# Patient Record
Sex: Male | Born: 1975
Health system: Southern US, Community
[De-identification: ages and names within clinical notes are randomized; demographics above are authoritative.]

## PROBLEM LIST (undated history)

## (undated) DIAGNOSIS — E119 Type 2 diabetes mellitus without complications: Secondary | ICD-10-CM

## (undated) DIAGNOSIS — C801 Malignant (primary) neoplasm, unspecified: Secondary | ICD-10-CM

## (undated) HISTORY — PX: WISDOM TOOTH EXTRACTION: SHX21

---

## 2012-01-14 ENCOUNTER — Ambulatory Visit (INDEPENDENT_AMBULATORY_CARE_PROVIDER_SITE_OTHER): Payer: PRIVATE HEALTH INSURANCE | Admitting: Ophthalmology

## 2012-01-14 DIAGNOSIS — E11359 Type 2 diabetes mellitus with proliferative diabetic retinopathy without macular edema: Secondary | ICD-10-CM

## 2012-01-14 DIAGNOSIS — E1139 Type 2 diabetes mellitus with other diabetic ophthalmic complication: Secondary | ICD-10-CM

## 2012-01-14 DIAGNOSIS — E1165 Type 2 diabetes mellitus with hyperglycemia: Secondary | ICD-10-CM

## 2012-01-14 DIAGNOSIS — H43819 Vitreous degeneration, unspecified eye: Secondary | ICD-10-CM

## 2012-01-14 DIAGNOSIS — H2510 Age-related nuclear cataract, unspecified eye: Secondary | ICD-10-CM

## 2013-01-19 ENCOUNTER — Ambulatory Visit (INDEPENDENT_AMBULATORY_CARE_PROVIDER_SITE_OTHER): Payer: PRIVATE HEALTH INSURANCE | Admitting: Ophthalmology

## 2013-03-06 ENCOUNTER — Ambulatory Visit (INDEPENDENT_AMBULATORY_CARE_PROVIDER_SITE_OTHER): Payer: Self-pay | Admitting: Ophthalmology

## 2013-05-11 ENCOUNTER — Ambulatory Visit (INDEPENDENT_AMBULATORY_CARE_PROVIDER_SITE_OTHER): Payer: BC Managed Care – PPO | Admitting: Ophthalmology

## 2013-05-11 DIAGNOSIS — H43819 Vitreous degeneration, unspecified eye: Secondary | ICD-10-CM

## 2013-05-11 DIAGNOSIS — H2510 Age-related nuclear cataract, unspecified eye: Secondary | ICD-10-CM

## 2013-05-11 DIAGNOSIS — E1165 Type 2 diabetes mellitus with hyperglycemia: Secondary | ICD-10-CM

## 2013-05-11 DIAGNOSIS — E1139 Type 2 diabetes mellitus with other diabetic ophthalmic complication: Secondary | ICD-10-CM

## 2013-05-11 DIAGNOSIS — E11359 Type 2 diabetes mellitus with proliferative diabetic retinopathy without macular edema: Secondary | ICD-10-CM

## 2014-05-26 ENCOUNTER — Ambulatory Visit (INDEPENDENT_AMBULATORY_CARE_PROVIDER_SITE_OTHER): Payer: 59 | Admitting: Ophthalmology

## 2014-05-26 DIAGNOSIS — H43813 Vitreous degeneration, bilateral: Secondary | ICD-10-CM | POA: Diagnosis not present

## 2014-05-26 DIAGNOSIS — E10359 Type 1 diabetes mellitus with proliferative diabetic retinopathy without macular edema: Secondary | ICD-10-CM | POA: Diagnosis not present

## 2014-05-26 DIAGNOSIS — E10319 Type 1 diabetes mellitus with unspecified diabetic retinopathy without macular edema: Secondary | ICD-10-CM

## 2015-06-01 ENCOUNTER — Ambulatory Visit (INDEPENDENT_AMBULATORY_CARE_PROVIDER_SITE_OTHER): Payer: 59 | Admitting: Ophthalmology

## 2015-06-27 ENCOUNTER — Ambulatory Visit (INDEPENDENT_AMBULATORY_CARE_PROVIDER_SITE_OTHER): Payer: BLUE CROSS/BLUE SHIELD | Admitting: Ophthalmology

## 2015-06-27 DIAGNOSIS — H2513 Age-related nuclear cataract, bilateral: Secondary | ICD-10-CM

## 2015-06-27 DIAGNOSIS — E103593 Type 1 diabetes mellitus with proliferative diabetic retinopathy without macular edema, bilateral: Secondary | ICD-10-CM

## 2015-06-27 DIAGNOSIS — E10319 Type 1 diabetes mellitus with unspecified diabetic retinopathy without macular edema: Secondary | ICD-10-CM

## 2015-06-27 DIAGNOSIS — H43813 Vitreous degeneration, bilateral: Secondary | ICD-10-CM | POA: Diagnosis not present

## 2016-06-27 DIAGNOSIS — E119 Type 2 diabetes mellitus without complications: Secondary | ICD-10-CM | POA: Diagnosis not present

## 2016-06-27 DIAGNOSIS — I10 Essential (primary) hypertension: Secondary | ICD-10-CM | POA: Diagnosis not present

## 2016-06-27 DIAGNOSIS — E785 Hyperlipidemia, unspecified: Secondary | ICD-10-CM | POA: Diagnosis not present

## 2016-06-28 ENCOUNTER — Ambulatory Visit (INDEPENDENT_AMBULATORY_CARE_PROVIDER_SITE_OTHER): Payer: 59 | Admitting: Ophthalmology

## 2016-06-28 DIAGNOSIS — E10319 Type 1 diabetes mellitus with unspecified diabetic retinopathy without macular edema: Secondary | ICD-10-CM | POA: Diagnosis not present

## 2016-06-28 DIAGNOSIS — E103593 Type 1 diabetes mellitus with proliferative diabetic retinopathy without macular edema, bilateral: Secondary | ICD-10-CM | POA: Diagnosis not present

## 2016-06-28 DIAGNOSIS — H43813 Vitreous degeneration, bilateral: Secondary | ICD-10-CM

## 2017-02-11 DIAGNOSIS — E119 Type 2 diabetes mellitus without complications: Secondary | ICD-10-CM | POA: Diagnosis not present

## 2017-02-11 DIAGNOSIS — E7800 Pure hypercholesterolemia, unspecified: Secondary | ICD-10-CM | POA: Diagnosis not present

## 2017-02-11 DIAGNOSIS — Z125 Encounter for screening for malignant neoplasm of prostate: Secondary | ICD-10-CM | POA: Diagnosis not present

## 2017-02-11 DIAGNOSIS — I10 Essential (primary) hypertension: Secondary | ICD-10-CM | POA: Diagnosis not present

## 2017-07-01 ENCOUNTER — Ambulatory Visit (INDEPENDENT_AMBULATORY_CARE_PROVIDER_SITE_OTHER): Payer: 59 | Admitting: Ophthalmology

## 2017-07-22 ENCOUNTER — Encounter (INDEPENDENT_AMBULATORY_CARE_PROVIDER_SITE_OTHER): Payer: 59 | Admitting: Ophthalmology

## 2017-07-22 DIAGNOSIS — E103593 Type 1 diabetes mellitus with proliferative diabetic retinopathy without macular edema, bilateral: Secondary | ICD-10-CM | POA: Diagnosis not present

## 2017-07-22 DIAGNOSIS — E10319 Type 1 diabetes mellitus with unspecified diabetic retinopathy without macular edema: Secondary | ICD-10-CM | POA: Diagnosis not present

## 2017-07-22 DIAGNOSIS — H43813 Vitreous degeneration, bilateral: Secondary | ICD-10-CM

## 2017-11-15 DIAGNOSIS — E7800 Pure hypercholesterolemia, unspecified: Secondary | ICD-10-CM | POA: Diagnosis not present

## 2017-11-15 DIAGNOSIS — I10 Essential (primary) hypertension: Secondary | ICD-10-CM | POA: Diagnosis not present

## 2017-11-15 DIAGNOSIS — E119 Type 2 diabetes mellitus without complications: Secondary | ICD-10-CM | POA: Diagnosis not present

## 2018-07-28 ENCOUNTER — Encounter (INDEPENDENT_AMBULATORY_CARE_PROVIDER_SITE_OTHER): Payer: 59 | Admitting: Ophthalmology

## 2018-09-17 ENCOUNTER — Encounter (INDEPENDENT_AMBULATORY_CARE_PROVIDER_SITE_OTHER): Payer: 59 | Admitting: Ophthalmology

## 2019-01-22 ENCOUNTER — Encounter (INDEPENDENT_AMBULATORY_CARE_PROVIDER_SITE_OTHER): Payer: Self-pay | Admitting: Ophthalmology

## 2019-03-11 ENCOUNTER — Encounter (INDEPENDENT_AMBULATORY_CARE_PROVIDER_SITE_OTHER): Payer: Self-pay | Admitting: Ophthalmology

## 2019-03-30 ENCOUNTER — Encounter (INDEPENDENT_AMBULATORY_CARE_PROVIDER_SITE_OTHER): Payer: Self-pay | Admitting: Ophthalmology

## 2019-04-01 ENCOUNTER — Encounter (INDEPENDENT_AMBULATORY_CARE_PROVIDER_SITE_OTHER): Payer: Self-pay | Admitting: Ophthalmology

## 2019-06-24 ENCOUNTER — Encounter (INDEPENDENT_AMBULATORY_CARE_PROVIDER_SITE_OTHER): Payer: Self-pay | Admitting: Ophthalmology

## 2020-06-20 ENCOUNTER — Other Ambulatory Visit: Payer: Self-pay | Admitting: Surgery

## 2020-06-20 ENCOUNTER — Other Ambulatory Visit: Payer: Self-pay | Admitting: Otolaryngology

## 2020-06-20 DIAGNOSIS — M7989 Other specified soft tissue disorders: Secondary | ICD-10-CM

## 2020-06-20 DIAGNOSIS — C029 Malignant neoplasm of tongue, unspecified: Secondary | ICD-10-CM

## 2020-07-04 ENCOUNTER — Ambulatory Visit
Admission: RE | Admit: 2020-07-04 | Discharge: 2020-07-04 | Disposition: A | Discharge status: Home / Self Care | Payer: 59 | Source: Ambulatory Visit | Attending: Otolaryngology | Admitting: Otolaryngology

## 2020-07-04 DIAGNOSIS — C029 Malignant neoplasm of tongue, unspecified: Secondary | ICD-10-CM

## 2020-07-04 MED ORDER — IOPAMIDOL (ISOVUE-300) INJECTION 61%
75.0000 mL | Freq: Once | INTRAVENOUS | Status: AC | PRN
  Administered 2020-07-04: 75 mL via INTRAVENOUS

## 2020-07-06 ENCOUNTER — Other Ambulatory Visit (HOSPITAL_COMMUNITY): Payer: Self-pay | Admitting: Otolaryngology

## 2020-07-06 ENCOUNTER — Other Ambulatory Visit: Payer: Self-pay | Admitting: Otolaryngology

## 2020-07-06 DIAGNOSIS — C029 Malignant neoplasm of tongue, unspecified: Secondary | ICD-10-CM

## 2020-07-22 ENCOUNTER — Ambulatory Visit (HOSPITAL_COMMUNITY)
Admission: RE | Admit: 2020-07-22 | Discharge: 2020-07-22 | Disposition: A | Discharge status: Home / Self Care | Payer: 59 | Source: Ambulatory Visit | Attending: Otolaryngology | Admitting: Otolaryngology

## 2020-07-22 ENCOUNTER — Other Ambulatory Visit: Payer: Self-pay

## 2020-07-22 DIAGNOSIS — C029 Malignant neoplasm of tongue, unspecified: Secondary | ICD-10-CM | POA: Insufficient documentation

## 2020-07-22 LAB — GLUCOSE, CAPILLARY: Glucose-Capillary: 169 mg/dL — ABNORMAL HIGH (ref 70–99)

## 2020-07-22 MED ORDER — FLUDEOXYGLUCOSE F - 18 (FDG) INJECTION
11.0000 | Freq: Once | INTRAVENOUS | Status: AC | PRN
  Administered 2020-07-22: 8.23 via INTRAVENOUS

## 2020-07-26 ENCOUNTER — Other Ambulatory Visit: Payer: Self-pay | Admitting: Otolaryngology

## 2020-07-27 NOTE — Pre-Procedure Instructions (Signed)
Surgical Instructions    Your procedure is scheduled on 07/30/20.  Report to [facility] Main Entrance "A" at 06:30 A.M., then check in with the Admitting office.  Call this number if you have problems the morning of surgery:  [phone]   If you have any questions prior to your surgery date call [phone]: Open Monday-Friday 8am-4pm    Remember:  Do not eat or drink after midnight the night before your surgery    Take these medicines the morning of surgery with A SIP OF WATER  oxyCODONE-acetaminophen (PERCOCET/ROXICET) if needed  As of today, STOP taking any Aspirin (unless otherwise instructed by your surgeon) Aleve, Naproxen, Ibuprofen, Motrin, Advil, Goody's, BC's, all herbal medications, fish oil, and all vitamins.  WHAT DO I DO ABOUT MY DIABETES MEDICATION?   THE NIGHT BEFORE SURGERY, take _____6-8______ units of ___Lantus______insulin.      THE MORNING OF SURGERY, take _____6-8________ units of ____Lantus______insulin.  If your CBG is greater than 220 mg/dL, you may take  of your sliding scale (correction) dose of insulin.   HOW TO MANAGE YOUR DIABETES BEFORE AND AFTER SURGERY  Why is it important to control my blood sugar before and after surgery? Improving blood sugar levels before and after surgery helps healing and can limit problems. A way of improving blood sugar control is eating a healthy diet by:  Eating less sugar and carbohydrates  Increasing activity/exercise  Talking with your doctor about reaching your blood sugar goals High blood sugars (greater than 180 mg/dL) can raise your risk of infections and slow your recovery, so you will need to focus on controlling your diabetes during the weeks before surgery. Make sure that the doctor who takes care of your diabetes knows about your planned surgery including the date and location.  How do I manage my blood sugar before surgery? Check your blood sugar at least 4 times a day, starting 2 days before  surgery, to make sure that the level is not too high or low.  Check your blood sugar the morning of your surgery when you wake up and every 2 hours until you get to the Short Stay unit.  If your blood sugar is less than 70 mg/dL, you will need to treat for low blood sugar: Do not take insulin. Treat a low blood sugar (less than 70 mg/dL) with  cup of clear juice (cranberry or apple), 4 glucose tablets, OR glucose gel. Recheck blood sugar in 15 minutes after treatment (to make sure it is greater than 70 mg/dL). If your blood sugar is not greater than 70 mg/dL on recheck, call [phone] for further instructions. Report your blood sugar to the short stay nurse when you get to Short Stay.  If you are admitted to the hospital after surgery: Your blood sugar will be checked by the staff and you will probably be given insulin after surgery (instead of oral diabetes medicines) to make sure you have good blood sugar levels. The goal for blood sugar control after surgery is 80-180 mg/dL.           Do not wear jewelry or makeup Do not wear lotions, powders, perfumes/colognes, or deodorant. Do not shave 48 hours prior to surgery.  Men may shave face and neck. Do not bring valuables to the hospital. DO Not wear nail polish, gel polish, artificial nails, or any other type of covering on  natural nails including finger and toenails. If patients have artificial nails, gel coating, etc. that need to be  removed by a nail salon please have this removed prior to surgery or surgery may need to be canceled/delayed if the surgeon/ anesthesia feels like the patient is unable to be adequately monitored.             [facility] is not responsible for any belongings or valuables.  Do NOT Smoke (Tobacco/Vaping) or drink Alcohol 24 hours prior to your procedure If you use a CPAP at night, you may bring all equipment for your overnight stay.   Contacts, glasses, dentures or bridgework may not be worn into surgery,  please bring cases for these belongings   For patients admitted to the hospital, discharge time will be determined by your treatment team.   Patients discharged the day of surgery will not be allowed to drive home, and someone needs to stay with them for 24 hours.  ONLY 1 SUPPORT PERSON MAY BE PRESENT WHILE YOU ARE IN SURGERY. IF YOU ARE TO BE ADMITTED ONCE YOU ARE IN YOUR ROOM YOU WILL BE ALLOWED TWO (2) VISITORS.  Minor children may have two parents present. Special consideration for safety and communication needs will be reviewed on a case by case basis.  Special instructions:    Oral Hygiene is also important to reduce your risk of infection.  Remember - BRUSH YOUR TEETH THE MORNING OF SURGERY WITH YOUR REGULAR TOOTHPASTE   [facility]- Preparing For Surgery  Before surgery, you can play an important role. Because skin is not sterile, your skin needs to be as free of germs as possible. You can reduce the number of germs on your skin by washing with CHG (chlorahexidine gluconate) Soap before surgery.  CHG is an antiseptic cleaner which kills germs and bonds with the skin to continue killing germs even after washing.     Please do not use if you have an allergy to CHG or antibacterial soaps. If your skin becomes reddened/irritated stop using the CHG.  Do not shave (including legs and underarms) for at least 48 hours prior to first CHG shower. It is OK to shave your face.  Please follow these instructions carefully.     Shower the NIGHT BEFORE SURGERY and the MORNING OF SURGERY with CHG Soap.   If you chose to wash your hair, wash your hair first as usual with your normal shampoo. After you shampoo, rinse your hair and body thoroughly to remove the shampoo.  Then [employer] and genitals (private parts) with your normal soap and rinse thoroughly to remove soap.  After that Use CHG Soap as you would any other liquid soap. You can apply CHG directly to the skin and wash gently with a scrungie  or a clean washcloth.   Apply the CHG Soap to your body ONLY FROM THE NECK DOWN.  Do not use on open wounds or open sores. Avoid contact with your eyes, ears, mouth and genitals (private parts). Wash Face and genitals (private parts)  with your normal soap.   Wash thoroughly, paying special attention to the area where your surgery will be performed.  Thoroughly rinse your body with warm water from the neck down.  DO NOT shower/wash with your normal soap after using and rinsing off the CHG Soap.  Pat yourself dry with a CLEAN TOWEL.  Wear CLEAN PAJAMAS to bed the night before surgery  Place CLEAN SHEETS on your bed the night before your surgery  DO NOT SLEEP WITH PETS.   Day of Surgery:  Take a shower with CHG  soap. Wear Clean/Comfortable clothing the morning of surgery Do not apply any deodorants/lotions.   Remember to brush your teeth WITH YOUR REGULAR TOOTHPASTE.   Please read over the following fact sheets that you were given.

## 2020-07-28 ENCOUNTER — Other Ambulatory Visit: Payer: Self-pay

## 2020-07-28 ENCOUNTER — Encounter (HOSPITAL_COMMUNITY)
Admission: RE | Admit: 2020-07-28 | Discharge: 2020-07-28 | Disposition: A | Discharge status: Home / Self Care | Payer: 59 | Source: Ambulatory Visit | Attending: Otolaryngology | Admitting: Otolaryngology

## 2020-07-28 ENCOUNTER — Encounter (HOSPITAL_COMMUNITY): Payer: Self-pay

## 2020-07-28 DIAGNOSIS — Z01818 Encounter for other preprocedural examination: Secondary | ICD-10-CM | POA: Diagnosis present

## 2020-07-28 HISTORY — DX: Malignant (primary) neoplasm, unspecified: C80.1

## 2020-07-28 HISTORY — DX: Type 2 diabetes mellitus without complications: E11.9

## 2020-07-28 LAB — CBC
HCT: 38.7 % — ABNORMAL LOW (ref 39.0–52.0)
Hemoglobin: 13.4 g/dL (ref 13.0–17.0)
MCH: 30.6 pg (ref 26.0–34.0)
MCHC: 34.6 g/dL (ref 30.0–36.0)
MCV: 88.4 fL (ref 80.0–100.0)
Platelets: 365 10*3/uL (ref 150–400)
RBC: 4.38 MIL/uL (ref 4.22–5.81)
RDW: 12.3 % (ref 11.5–15.5)
WBC: 6.5 10*3/uL (ref 4.0–10.5)
nRBC: 0 % (ref 0.0–0.2)

## 2020-07-28 LAB — BASIC METABOLIC PANEL
Anion gap: 7 (ref 5–15)
BUN: 9 mg/dL (ref 6–20)
CO2: 26 mmol/L (ref 22–32)
Calcium: 9.1 mg/dL (ref 8.9–10.3)
Chloride: 104 mmol/L (ref 98–111)
Creatinine, Ser: 0.91 mg/dL (ref 0.61–1.24)
GFR, Estimated: >60 mL/min (ref >60)
Glucose, Bld: 256 mg/dL — ABNORMAL HIGH (ref 70–99)
Potassium: 4.2 mmol/L (ref 3.5–5.1)
Sodium: 137 mmol/L (ref 135–145)

## 2020-07-28 LAB — HEMOGLOBIN A1C
Hgb A1c MFr Bld: 7.2 % — ABNORMAL HIGH (ref 4.8–5.6)
Mean Plasma Glucose: 159.94 mg/dL

## 2020-07-28 LAB — GLUCOSE, CAPILLARY: Glucose-Capillary: 253 mg/dL — ABNORMAL HIGH (ref 70–99)

## 2020-07-28 NOTE — Progress Notes (Signed)
PCP: Shirline Frees Cardiologist: Denies  EKG: 07/28/20 CXR: denies ECHO: denies Stress Test: denies Cardiac Cath: denies  Fasting Blood Sugar- 70-250's Checks Blood Sugar__4-5_ times a day  OSA/CPAP: No  ASA/Blood Thinners: No  Covid test not needed  Anesthesia Review: No  Patient denies shortness of breath, fever, cough, and chest pain at PAT appointment.  Patient verbalized understanding of instructions provided today at the PAT appointment.  Patient asked to review instructions at home and day of surgery.

## 2020-07-30 ENCOUNTER — Observation Stay (HOSPITAL_COMMUNITY)
Admission: RE | Admit: 2020-07-30 | Discharge: 2020-07-31 | Disposition: A | Discharge status: Home / Self Care | Payer: 59 | Attending: Otolaryngology | Admitting: Otolaryngology

## 2020-07-30 ENCOUNTER — Ambulatory Visit (HOSPITAL_COMMUNITY): Payer: 59 | Admitting: Anesthesiology

## 2020-07-30 ENCOUNTER — Encounter (HOSPITAL_COMMUNITY)
Admission: RE | Disposition: A | Discharge status: Home / Self Care | Payer: Self-pay | Source: Home / Self Care | Attending: Otolaryngology

## 2020-07-30 ENCOUNTER — Other Ambulatory Visit: Payer: Self-pay

## 2020-07-30 ENCOUNTER — Encounter (HOSPITAL_COMMUNITY): Payer: Self-pay | Admitting: Otolaryngology

## 2020-07-30 DIAGNOSIS — E119 Type 2 diabetes mellitus without complications: Secondary | ICD-10-CM | POA: Insufficient documentation

## 2020-07-30 DIAGNOSIS — I10 Essential (primary) hypertension: Secondary | ICD-10-CM | POA: Insufficient documentation

## 2020-07-30 DIAGNOSIS — Z20822 Contact with and (suspected) exposure to covid-19: Secondary | ICD-10-CM | POA: Insufficient documentation

## 2020-07-30 DIAGNOSIS — Z794 Long term (current) use of insulin: Secondary | ICD-10-CM | POA: Insufficient documentation

## 2020-07-30 DIAGNOSIS — C029 Malignant neoplasm of tongue, unspecified: Principal | ICD-10-CM | POA: Insufficient documentation

## 2020-07-30 DIAGNOSIS — Z9049 Acquired absence of other specified parts of digestive tract: Secondary | ICD-10-CM

## 2020-07-30 HISTORY — PX: PARTIAL GLOSSECTOMY: SHX2173

## 2020-07-30 LAB — GLUCOSE, CAPILLARY
Glucose-Capillary: 164 mg/dL — ABNORMAL HIGH (ref 70–99)
Glucose-Capillary: 175 mg/dL — ABNORMAL HIGH (ref 70–99)
Glucose-Capillary: 159 mg/dL — ABNORMAL HIGH (ref 70–99)
Glucose-Capillary: 183 mg/dL — ABNORMAL HIGH (ref 70–99)
Glucose-Capillary: 155 mg/dL — ABNORMAL HIGH (ref 70–99)
Glucose-Capillary: 88 mg/dL (ref 70–99)

## 2020-07-30 LAB — SARS CORONAVIRUS 2 BY RT PCR (HOSPITAL ORDER, PERFORMED IN ~~LOC~~ HOSPITAL LAB): SARS Coronavirus 2: NEGATIVE

## 2020-07-30 SURGERY — GLOSSECTOMY, PARTIAL
Anesthesia: General | Laterality: Right

## 2020-07-30 MED ORDER — LIDOCAINE-EPINEPHRINE 1 %-1:100000 IJ SOLN
INTRAMUSCULAR | Status: DC | PRN
  Administered 2020-07-30: 8 mL via INTRADERMAL

## 2020-07-30 MED ORDER — PROPOFOL 10 MG/ML IV BOLUS
INTRAVENOUS | Status: AC
  Filled 2020-07-30: qty 40

## 2020-07-30 MED ORDER — FENTANYL CITRATE (PF) 250 MCG/5ML IJ SOLN
INTRAMUSCULAR | Status: DC | PRN
  Administered 2020-07-30: 100 ug via INTRAVENOUS

## 2020-07-30 MED ORDER — ORAL CARE MOUTH RINSE: 15.0000 mL | Freq: Once | OROMUCOSAL | Status: DC

## 2020-07-30 MED ORDER — DEXMEDETOMIDINE (PRECEDEX) IN NS 20 MCG/5ML (4 MCG/ML) IV SYRINGE
PREFILLED_SYRINGE | INTRAVENOUS | Status: DC | PRN
  Administered 2020-07-30: 4 ug via INTRAVENOUS
  Administered 2020-07-30: 12 ug via INTRAVENOUS

## 2020-07-30 MED ORDER — MIDAZOLAM HCL 2 MG/2ML IJ SOLN
INTRAMUSCULAR | Status: DC | PRN
  Administered 2020-07-30: 2 mg via INTRAVENOUS

## 2020-07-30 MED ORDER — FAMOTIDINE IN NACL 20-0.9 MG/50ML-% IV SOLN
20.0000 mg | INTRAVENOUS | Status: DC
  Administered 2020-07-30: 20 mg via INTRAVENOUS
  Filled 2020-07-30 (×2): qty 50

## 2020-07-30 MED ORDER — ONDANSETRON HCL 4 MG/2ML IJ SOLN
INTRAMUSCULAR | Status: DC | PRN
  Administered 2020-07-30: 4 mg via INTRAVENOUS

## 2020-07-30 MED ORDER — INSULIN GLARGINE 100 UNIT/ML ~~LOC~~ SOLN
8.0000 [IU] | Freq: Two times a day (BID) | SUBCUTANEOUS | Status: DC
  Administered 2020-07-30 – 2020-07-31 (×2): 8 [IU] via SUBCUTANEOUS
  Filled 2020-07-30 (×3): qty 0.08

## 2020-07-30 MED ORDER — PROPOFOL 500 MG/50ML IV EMUL
INTRAVENOUS | Status: DC | PRN
  Administered 2020-07-30: 150 ug/kg/min via INTRAVENOUS

## 2020-07-30 MED ORDER — HYDROMORPHONE HCL 1 MG/ML IJ SOLN
INTRAMUSCULAR | Status: AC
  Filled 2020-07-30: qty 0.5

## 2020-07-30 MED ORDER — AMISULPRIDE (ANTIEMETIC) 5 MG/2ML IV SOLN
10.0000 mg | Freq: Once | INTRAVENOUS | Status: DC | PRN
Start: 2020-07-30 — End: 2020-07-30

## 2020-07-30 MED ORDER — MORPHINE SULFATE (PF) 2 MG/ML IV SOLN
2.0000 mg | INTRAVENOUS | Status: DC | PRN
Start: 2020-07-30 — End: 2020-07-31

## 2020-07-30 MED ORDER — PROPOFOL 10 MG/ML IV BOLUS
INTRAVENOUS | Status: DC | PRN
  Administered 2020-07-30: 50 mg via INTRAVENOUS
  Administered 2020-07-30: 30 mg via INTRAVENOUS
  Administered 2020-07-30: 150 mg via INTRAVENOUS
  Administered 2020-07-30: 30 mg via INTRAVENOUS

## 2020-07-30 MED ORDER — PROPOFOL 1000 MG/100ML IV EMUL
INTRAVENOUS | Status: AC
  Filled 2020-07-30: qty 200

## 2020-07-30 MED ORDER — HYDROCODONE-ACETAMINOPHEN 7.5-325 MG/15ML PO SOLN
10.0000 mL | ORAL | Status: DC | PRN
Start: 2020-07-30 — End: 2020-07-31
  Administered 2020-07-30 – 2020-07-31 (×3): 15 mL via ORAL
  Filled 2020-07-30 (×3): qty 15

## 2020-07-30 MED ORDER — CEFAZOLIN SODIUM-DEXTROSE 1-4 GM/50ML-% IV SOLN
1.0000 g | Freq: Three times a day (TID) | INTRAVENOUS | Status: AC
  Administered 2020-07-30 – 2020-07-31 (×3): 1 g via INTRAVENOUS
  Filled 2020-07-30 (×3): qty 50

## 2020-07-30 MED ORDER — BACITRACIN ZINC 500 UNIT/GM EX OINT
TOPICAL_OINTMENT | CUTANEOUS | Status: AC
  Filled 2020-07-30: qty 28.35

## 2020-07-30 MED ORDER — LIDOCAINE-EPINEPHRINE 1 %-1:100000 IJ SOLN
INTRAMUSCULAR | Status: AC
  Filled 2020-07-30: qty 1

## 2020-07-30 MED ORDER — PHENYLEPHRINE HCL-NACL 10-0.9 MG/250ML-% IV SOLN
INTRAVENOUS | Status: AC
  Filled 2020-07-30: qty 250

## 2020-07-30 MED ORDER — SODIUM CHLORIDE 0.9 % IV SOLN
0.0500 ug/kg/min | INTRAVENOUS | Status: DC
  Administered 2020-07-30: .1 ug/kg/min via INTRAVENOUS
  Filled 2020-07-30: qty 4000

## 2020-07-30 MED ORDER — ACETAMINOPHEN 500 MG PO TABS
1000.0000 mg | ORAL_TABLET | Freq: Once | ORAL | Status: AC
  Administered 2020-07-30: 1000 mg via ORAL
  Filled 2020-07-30: qty 2

## 2020-07-30 MED ORDER — MIDAZOLAM HCL 2 MG/2ML IJ SOLN
INTRAMUSCULAR | Status: AC
  Filled 2020-07-30: qty 2

## 2020-07-30 MED ORDER — FENTANYL CITRATE (PF) 250 MCG/5ML IJ SOLN
INTRAMUSCULAR | Status: AC
  Filled 2020-07-30: qty 5

## 2020-07-30 MED ORDER — LACTATED RINGERS IV SOLN: INTRAVENOUS | Status: DC

## 2020-07-30 MED ORDER — CEFAZOLIN SODIUM-DEXTROSE 2-4 GM/100ML-% IV SOLN
INTRAVENOUS | Status: AC
  Filled 2020-07-30: qty 100

## 2020-07-30 MED ORDER — SUCCINYLCHOLINE CHLORIDE 200 MG/10ML IV SOSY
PREFILLED_SYRINGE | INTRAVENOUS | Status: AC
  Filled 2020-07-30: qty 10

## 2020-07-30 MED ORDER — HYDROMORPHONE HCL 1 MG/ML IJ SOLN
INTRAMUSCULAR | Status: DC | PRN
  Administered 2020-07-30: .25 mg via INTRAVENOUS
  Administered 2020-07-30: .5 mg via INTRAVENOUS
  Administered 2020-07-30: .25 mg via INTRAVENOUS

## 2020-07-30 MED ORDER — INSULIN ASPART 100 UNIT/ML ~~LOC~~ SOLN
1.0000 [IU] | Freq: Three times a day (TID) | SUBCUTANEOUS | Status: DC
  Administered 2020-07-30: 4 [IU] via SUBCUTANEOUS
  Administered 2020-07-31: 9 [IU] via SUBCUTANEOUS
  Administered 2020-07-31: 4 [IU] via SUBCUTANEOUS

## 2020-07-30 MED ORDER — HYDROMORPHONE HCL 1 MG/ML IJ SOLN: 0.2500 mg | INTRAMUSCULAR | Status: DC | PRN

## 2020-07-30 MED ORDER — CHLORHEXIDINE GLUCONATE 0.12 % MT SOLN
15.0000 mL | Freq: Once | OROMUCOSAL | Status: DC
  Filled 2020-07-30: qty 15

## 2020-07-30 MED ORDER — PHENYLEPHRINE HCL-NACL 10-0.9 MG/250ML-% IV SOLN
INTRAVENOUS | Status: DC | PRN
  Administered 2020-07-30: 25 ug/min via INTRAVENOUS

## 2020-07-30 MED ORDER — KCL IN DEXTROSE-NACL 20-5-0.45 MEQ/L-%-% IV SOLN
INTRAVENOUS | Status: DC
  Filled 2020-07-30: qty 1000

## 2020-07-30 MED ORDER — LACTATED RINGERS IV SOLN: INTRAVENOUS | Status: DC | PRN

## 2020-07-30 MED ORDER — ONDANSETRON HCL 4 MG/2ML IJ SOLN
4.0000 mg | Freq: Four times a day (QID) | INTRAMUSCULAR | Status: DC | PRN
  Administered 2020-07-30: 4 mg via INTRAVENOUS
  Filled 2020-07-30 (×2): qty 2

## 2020-07-30 MED ORDER — LIDOCAINE 2% (20 MG/ML) 5 ML SYRINGE
INTRAMUSCULAR | Status: DC | PRN
  Administered 2020-07-30: 40 mg via INTRAVENOUS
  Administered 2020-07-30: 60 mg via INTRAVENOUS

## 2020-07-30 MED ORDER — SUCCINYLCHOLINE 20MG/ML (10ML) SYRINGE FOR MEDFUSION PUMP - OPTIME
INTRAMUSCULAR | Status: DC | PRN
  Administered 2020-07-30: 100 mg via INTRAVENOUS

## 2020-07-30 MED ORDER — CEFAZOLIN SODIUM-DEXTROSE 2-4 GM/100ML-% IV SOLN
2.0000 g | Freq: Once | INTRAVENOUS | Status: AC
  Administered 2020-07-30: 2 g via INTRAVENOUS

## 2020-07-30 MED ORDER — LISINOPRIL 5 MG PO TABS
5.0000 mg | ORAL_TABLET | Freq: Every day | ORAL | Status: DC
  Administered 2020-07-31: 5 mg via ORAL
  Filled 2020-07-30 (×2): qty 1

## 2020-07-30 MED ORDER — LIDOCAINE-EPINEPHRINE 2 %-1:100000 IJ SOLN
INTRAMUSCULAR | Status: AC
  Filled 2020-07-30: qty 1

## 2020-07-30 SURGICAL SUPPLY — 46 items
ATTRACTOMAT 16X20 MAGNETIC DRP (DRAPES) IMPLANT
BAG COUNTER SPONGE SURGICOUNT (BAG) ×2 IMPLANT
BLADE SURG 15 STRL LF DISP TIS (BLADE) ×2 IMPLANT
BLADE SURG 15 STRL SS (BLADE) ×2
CANISTER SUCT 3000ML PPV (MISCELLANEOUS) ×2 IMPLANT
CLEANER TIP ELECTROSURG 2X2 (MISCELLANEOUS) ×4 IMPLANT
CNTNR URN SCR LID CUP LEK RST (MISCELLANEOUS) ×5 IMPLANT
CONT SPEC 4OZ STRL OR WHT (MISCELLANEOUS) ×5
CORD BIPOLAR FORCEPS 12FT (ELECTRODE) ×2 IMPLANT
COVER MAYO STAND STRL (DRAPES) ×2 IMPLANT
COVER SURGICAL LIGHT HANDLE (MISCELLANEOUS) ×2 IMPLANT
DERMABOND ADVANCED (GAUZE/BANDAGES/DRESSINGS) ×1
DERMABOND ADVANCED .7 DNX12 (GAUZE/BANDAGES/DRESSINGS) ×1 IMPLANT
DRAIN CHANNEL 10F 3/8 F FF (DRAIN) IMPLANT
DRAIN SNY 10 ROU (WOUND CARE) ×2 IMPLANT
DRAPE HALF SHEET 40X57 (DRAPES) ×2 IMPLANT
ELECT COATED BLADE 2.86 ST (ELECTRODE) ×2 IMPLANT
ELECT REM PT RETURN 9FT ADLT (ELECTROSURGICAL) ×2 IMPLANT
ELECTRODE REM PT RTRN 9FT ADLT (ELECTROSURGICAL) ×1 IMPLANT
EVACUATOR SILICONE 100CC (DRAIN) ×2 IMPLANT
GAUZE 4X4 16PLY ~~LOC~~+RFID DBL (SPONGE) IMPLANT
GLOVE SURG LTX SZ7.5 (GLOVE) ×2 IMPLANT
GOWN STRL REUS W/ TWL LRG LVL3 (GOWN DISPOSABLE) ×2 IMPLANT
GOWN STRL REUS W/TWL LRG LVL3 (GOWN DISPOSABLE) ×2
KIT BASIN OR (CUSTOM PROCEDURE TRAY) ×2 IMPLANT
KIT TURNOVER KIT B (KITS) ×2 IMPLANT
LOCATOR NERVE 3 VOLT (DISPOSABLE) IMPLANT
NS IRRIG 1000ML POUR BTL (IV SOLUTION) ×2 IMPLANT
PAD ARMBOARD 7.5X6 YLW CONV (MISCELLANEOUS) ×4 IMPLANT
PENCIL SMOKE EVACUATOR (MISCELLANEOUS) ×4 IMPLANT
SHEARS HARMONIC 9CM CVD (BLADE) ×2 IMPLANT
SPECIMEN JAR MEDIUM (MISCELLANEOUS) ×4 IMPLANT
SPONGE T-LAP 18X18 ~~LOC~~+RFID (SPONGE) ×2 IMPLANT
STAPLER VISISTAT 35W (STAPLE) ×2 IMPLANT
SUT ETHILON 3 0 PS 1 (SUTURE) ×2 IMPLANT
SUT SILK 2 0 (SUTURE) ×1
SUT SILK 2 0 SH CR/8 (SUTURE) ×2 IMPLANT
SUT SILK 2-0 18XBRD TIE 12 (SUTURE) ×1 IMPLANT
SUT VIC AB 3-0 SH 27 (SUTURE) ×4
SUT VIC AB 3-0 SH 27X BRD (SUTURE) ×4 IMPLANT
SUT VIC AB 4-0 PS2 18 (SUTURE) ×4 IMPLANT
SUT VICRYL 4-0 PS2 18IN ABS (SUTURE) ×6 IMPLANT
TOWEL GREEN STERILE FF (TOWEL DISPOSABLE) ×2 IMPLANT
TRAY ENT MC OR (CUSTOM PROCEDURE TRAY) ×2 IMPLANT
TRAY FOLEY MTR SLVR 14FR STAT (SET/KITS/TRAYS/PACK) ×2 IMPLANT
TUBE FEEDING 10FR FLEXIFLO (MISCELLANEOUS) IMPLANT

## 2020-07-30 NOTE — Plan of Care (Signed)
  Problem: Education: Goal: Knowledge of General Education information will improve Description: Including pain rating scale, medication(s)/side effects and non-pharmacologic comfort measures Outcome: Progressing   Problem: Health Behavior/Discharge Planning: Goal: Ability to manage health-related needs will improve Outcome: Progressing   Problem: Clinical Measurements: Goal: Ability to maintain clinical measurements within normal limits will improve Outcome: Progressing Goal: Respiratory complications will improve Outcome: Progressing Goal: Cardiovascular complication will be avoided Outcome: Progressing   Problem: Pain Managment: Goal: General experience of comfort will improve Outcome: Progressing   Problem: Safety: Goal: Ability to remain free from injury will improve Outcome: Progressing   Problem: Skin Integrity: Goal: Risk for impaired skin integrity will decrease Outcome: Progressing   Problem: Clinical Measurements: Goal: Ability to maintain clinical measurements within normal limits will improve Outcome: Progressing Goal: Postoperative complications will be avoided or minimized Outcome: Progressing

## 2020-07-30 NOTE — Anesthesia Procedure Notes (Signed)
Procedure Name: Intubation Date/Time: 07/30/2020 8:24 AM Performed by: Rande Brunt, CRNA Pre-anesthesia Checklist: Patient identified, Emergency Drugs available, Suction available and Patient being monitored Patient Re-evaluated:Patient Re-evaluated prior to induction Oxygen Delivery Method: Circle System Utilized Preoxygenation: Pre-oxygenation with 100% oxygen Induction Type: IV induction Ventilation: Mask ventilation without difficulty Laryngoscope Size: Mac and 4 Grade View: Grade II Tube type: Oral Number of attempts: 1 Airway Equipment and Method: Stylet Placement Confirmation: ETT inserted through vocal cords under direct vision, positive ETCO2 and breath sounds checked- equal and bilateral Secured at: 22 cm Tube secured with: Tape Dental Injury: Teeth and Oropharynx as per pre-operative assessment

## 2020-07-30 NOTE — H&P (Signed)
Cc: Tongue cancer  HPI: The patient is a 45 year old male who returns today for his follow-up evaluation. The patient has a non healing right lateral tongue ulcer.  It was approximately 1 cm in diameter.  He underwent biopsy of the lesion in my office at the last visit.  The pathology was consistent with squamous cell carcinoma.  He subsequently underwent a neck CT scan.  The CT showed a focus of ulceration and abnormal mucosal enhancement along the right aspect of the tongue, measuring 1.5 cm in diameter.  He also has a mildly enlarged right level II lymph node.  A PET scan was recommended. The patient returns today reporting persistent soreness around the right lateral tongue.  He denies any dysphagia, odynophagia or dyspnea.  He has no history of tobacco use. No other ENT, GI, or respiratory issue noted since the last visit.   Exam: General: Communicates without difficulty, well nourished, no acute distress. Head: Normocephalic, no evidence injury, no tenderness, facial buttresses intact without stepoff. Face/sinus: No tenderness to palpation and percussion. Facial movement is normal and symmetric. Eyes: PERRL, EOMI. No scleral icterus, conjunctivae clear. Neuro: CN II exam reveals vision grossly intact. No nystagmus at any point of gaze. Ears: Auricles well formed without lesions. Ear canals are intact without mass or lesion. No erythema or edema is appreciated. The TMs are intact without fluid. Nose: External evaluation reveals normal support and skin without lesions. Dorsum is intact. Anterior rhinoscopy reveals pink mucosa over anterior aspect of inferior turbinates and intact septum. No purulence noted. Oral:  The patient is noted to have a 1.5 cm right lateral tongue ulceration. The area is tender to touch. Neck: Full range of motion without pain. There is no significant lymphadenopathy. No masses palpable. Thyroid bed within normal limits to palpation. Parotid glands and submandibular glands equal  bilaterally without mass. Trachea is midline. Neuro:  CN 2-12 grossly intact. Gait normal.   Assessment  1.  Right lateral tongue squamous cell carcinoma, with possible lymph node metastases to the right neck.  On his CT scan, the lesion is approximately 1.5 cm in diameter. This is likely a T1N1 SCCA.  Plan  1.  The physical exam findings, pathology results, and CT images are reviewed with the patient.  2.  A PET CT scan to evaluate for distant metastases.   3.  The patient will likely need to undergo a right partial glossectomy and right neck dissection to treat his malignancy.  Pending the results of the lymph node pathology, the patient may or may not need further chemoradiation treatment.   4.  The risks, benefits, alternatives and details of the treatment options are extensively discussed.  Questions are invited and answered.  5.  The patient would like to proceed with the procedures.

## 2020-07-30 NOTE — H&P (View-Only) (Signed)
Cc: Tongue cancer  HPI: The patient is a 45 year old male who returns today for his follow-up evaluation. The patient has a non healing right lateral tongue ulcer.  It was approximately 1 cm in diameter.  He underwent biopsy of the lesion in my office at the last visit.  The pathology was consistent with squamous cell carcinoma.  He subsequently underwent a neck CT scan.  The CT showed a focus of ulceration and abnormal mucosal enhancement along the right aspect of the tongue, measuring 1.5 cm in diameter.  He also has a mildly enlarged right level II lymph node.  A PET scan was recommended. The patient returns today reporting persistent soreness around the right lateral tongue.  He denies any dysphagia, odynophagia or dyspnea.  He has no history of tobacco use. No other ENT, GI, or respiratory issue noted since the last visit.   Exam: General: Communicates without difficulty, well nourished, no acute distress. Head: Normocephalic, no evidence injury, no tenderness, facial buttresses intact without stepoff. Face/sinus: No tenderness to palpation and percussion. Facial movement is normal and symmetric. Eyes: PERRL, EOMI. No scleral icterus, conjunctivae clear. Neuro: CN II exam reveals vision grossly intact. No nystagmus at any point of gaze. Ears: Auricles well formed without lesions. Ear canals are intact without mass or lesion. No erythema or edema is appreciated. The TMs are intact without fluid. Nose: External evaluation reveals normal support and skin without lesions. Dorsum is intact. Anterior rhinoscopy reveals pink mucosa over anterior aspect of inferior turbinates and intact septum. No purulence noted. Oral:  The patient is noted to have a 1.5 cm right lateral tongue ulceration. The area is tender to touch. Neck: Full range of motion without pain. There is no significant lymphadenopathy. No masses palpable. Thyroid bed within normal limits to palpation. Parotid glands and submandibular glands equal  bilaterally without mass. Trachea is midline. Neuro:  CN 2-12 grossly intact. Gait normal.   Assessment  1.  Right lateral tongue squamous cell carcinoma, with possible lymph node metastases to the right neck.  On his CT scan, the lesion is approximately 1.5 cm in diameter. This is likely a T1N1 SCCA.  Plan  1.  The physical exam findings, pathology results, and CT images are reviewed with the patient.  2.  A PET CT scan to evaluate for distant metastases.   3.  The patient will likely need to undergo a right partial glossectomy and right neck dissection to treat his malignancy.  Pending the results of the lymph node pathology, the patient may or may not need further chemoradiation treatment.   4.  The risks, benefits, alternatives and details of the treatment options are extensively discussed.  Questions are invited and answered.  5.  The patient would like to proceed with the procedures.

## 2020-07-30 NOTE — Discharge Instructions (Addendum)
Glossectomy, Care After This sheet gives you information about how to care for yourself after your procedure. Your health care provider may also give you more specific instructions. If you have problems or questions, contact your health careprovider. What can I expect after the procedure? After the procedure, it is common to have: Difficulty swallowing, eating, and speaking while you recover from your surgery. Difficulty moving your mouth and jaw. Pain and soreness in your tongue and mouth. A small amount of blood or clear fluid coming from your incisions and from any donor sites. A donor site is the area from which skin or muscle tissue was taken to replace your tongue tissue (graft). Follow these instructions at home: Medicines Take over-the-counter and prescription medicines only as told by your health care provider. If you were prescribed an antibiotic medicine, take it as told by your health care provider. Do not stop using the antibiotic even if you start to feel better. Ask your health care provider if the medicine prescribed to you: Requires you to avoid driving or using heavy machinery. Can cause constipation. You may need to take actions to prevent or treat constipation, such as: Drink enough fluid to keep your urine pale yellow. Take over-the-counter or prescription medicines.   Incision care Follow instructions from your health care provider about how to take care of your incisions. Make sure you: Leave stitches (sutures), skin glue, or adhesive strips in place. These skin closures may need to stay in place for 2 weeks or longer. If adhesive strip edges start to loosen and curl up, you may trim the loose edges. Do not remove adhesive strips completely unless your health care provider tells you to do that. Check your incision areas every day for signs of infection. Check for: More redness, swelling, or pain. More fluid or blood. Warmth. Pus or a bad smell. If you have drainage  tubes, follow instructions from your health care provider about how to care for them.   Eating and drinking Follow instructions from your health care provider about eating and drinking restrictions. Restrictions vary depending on the type of procedure you had. If you are eating by mouth, rinse your mouth with water after every meal to prevent food from becoming trapped in your mouth. Activity Return to your normal activities as told by your health care provider. Ask your health care provider what activities are safe for you. Sleep and rest with your head raised (elevated) to help reduce swelling. Do speech exercises as told by your health care provider or speech therapist. General instructions Do not use any products that contain nicotine or tobacco, such as cigarettes, e-cigarettes, and chewing tobacco. If you need help quitting, ask your health care provider. Gargle or swish water in your mouth as told by your health care provider. If you have a tube inserted into your windpipe to help you breathe (tracheostomy tube), follow instructions from your health care provider about how to care for the tube. Keep all follow-up visits as told by your health care provider. This is important. Contact a health care provider if: You have a fever. You have pain that gets worse or does not get better with medicine. You have more redness, swelling, or pain around an incision. You have more fluid or blood coming from an incision in the neck or the mouth. Your incision feels warm to the touch. You have pus or a bad smell coming from an incision. It gets more difficult for you to chew and swallow. You  have a cough that gets worse. You feel nauseous. Get help right away if: You have new shortness of breath. You have a fast heartbeat. You have a lot of blood coming from your incisions in your mouth or neck. You vomit. You have chest pain. Summary It is common to have difficulty swallowing, eating, and  speaking while you recover from your surgery. Check your incision areas every day for signs of infection. Follow instructions from your health care provider about eating and drinking restrictions. Keep all follow-up visits as told by your health care provider. This is important. This information is not intended to replace advice given to you by your health care provider. Make sure you discuss any questions you have with your healthcare provider. Document Revised: 10/28/2017 Document Reviewed: 10/28/2017 Elsevier Patient Education  2022 [employer].

## 2020-07-30 NOTE — Transfer of Care (Signed)
Immediate Anesthesia Transfer of Care Note  Patient: Shawn Rhodes  Procedure(s) Performed: PARTIAL GLOSSECTOMY WITH RIGHT NECK DISSECTION (Right)  Patient Location: PACU  Anesthesia Type:General  Level of Consciousness: drowsy, patient cooperative and responds to stimulation  Airway & Oxygen Therapy: Patient Spontanous Breathing  Post-op Assessment: Report given to RN, Post -op Vital signs reviewed and stable and Patient moving all extremities X 4  Post vital signs: Reviewed and stable  Last Vitals:  Vitals Value Taken Time  BP 118/75 07/30/20 1211  Temp    Pulse 87 07/30/20 1212  Resp 19 07/30/20 1212  SpO2 94 % 07/30/20 1212  Vitals shown include unvalidated device data.  Last Pain:  Vitals:   07/30/20 0726  TempSrc: Oral  PainSc: 6       Patients Stated Pain Goal: 2 ([id number])  Complications: No notable events documented.

## 2020-07-30 NOTE — Anesthesia Preprocedure Evaluation (Signed)
Anesthesia Evaluation  Patient identified by MRN, date of birth, ID band Patient awake    Reviewed: Allergy & Precautions, NPO status , Patient's Chart, lab work & pertinent test results  Airway Mallampati: II  TM Distance: >3 FB     Dental  (+) Dental Advisory Given   Pulmonary neg pulmonary ROS,    breath sounds clear to auscultation       Cardiovascular hypertension, Pt. on medications  Rhythm:Regular Rate:Normal     Neuro/Psych negative neurological ROS     GI/Hepatic negative GI ROS, Neg liver ROS,   Endo/Other  diabetes, Insulin Dependent  Renal/GU negative Renal ROS     Musculoskeletal   Abdominal   Peds  Hematology negative hematology ROS (+)   Anesthesia Other Findings   Reproductive/Obstetrics                             Anesthesia Physical Anesthesia Plan  ASA: 2  Anesthesia Plan: General   Post-op Pain Management:    Induction: Intravenous  PONV Risk Score and Plan: 3 and Dexamethasone, Ondansetron, Treatment may vary due to age or medical condition and Midazolam  Airway Management Planned: Oral ETT  Additional Equipment: None  Intra-op Plan:   Post-operative Plan: Extubation in OR  Informed Consent: I have reviewed the patients History and Physical, chart, labs and discussed the procedure including the risks, benefits and alternatives for the proposed anesthesia with the patient or authorized representative who has indicated his/her understanding and acceptance.     Dental advisory given  Plan Discussed with: CRNA  Anesthesia Plan Comments:         Anesthesia Quick Evaluation

## 2020-07-30 NOTE — Anesthesia Postprocedure Evaluation (Signed)
Anesthesia Post Note  Patient: Shawn Rhodes  Procedure(s) Performed: PARTIAL GLOSSECTOMY WITH RIGHT NECK DISSECTION (Right)     Patient location during evaluation: PACU Anesthesia Type: General Level of consciousness: awake and alert Pain management: pain level controlled Vital Signs Assessment: post-procedure vital signs reviewed and stable Respiratory status: spontaneous breathing, nonlabored ventilation, respiratory function stable and patient connected to nasal cannula oxygen Cardiovascular status: blood pressure returned to baseline and stable Postop Assessment: no apparent nausea or vomiting Anesthetic complications: no   No notable events documented.  Last Vitals:  Vitals:   07/30/20 1412 07/30/20 1446  BP: 132/70 140/74  Pulse: 77 84  Resp: 13 15  Temp:  37.2 C  SpO2: 98% 99%    Last Pain:  Vitals:   07/30/20 1446  TempSrc: Oral  PainSc: 0-No pain                 Tiajuana Amass

## 2020-07-30 NOTE — Op Note (Signed)
DATE OF PROCEDURE:  07/30/2020                              OPERATIVE REPORT  SURGEON:  [name], MD  PREOPERATIVE DIAGNOSES: 1.  Right lateral tongue squamous cell carcinoma.   POSTOPERATIVE DIAGNOSES: 1.  Right lateral tongue squamous of carcinoma   PROCEDURE PERFORMED: Right partial glossectomy with right neck dissection.   ANESTHESIA:  General endotracheal tube anesthesia.   COMPLICATIONS:  None.   ESTIMATED BLOOD LOSS: 80 ml   INDICATION FOR PROCEDURE:NAME@ is a 45 y.o. male with a history of a non healing right lateral tongue ulcer.  He underwent biopsy of the lesion in my office.  The pathology was consistent with squamous cell carcinoma.  He subsequently underwent a neck CT scan.  The CT showed a focus of ulceration and abnormal mucosal enhancement along the right aspect of the tongue, measuring 1.5 cm in diameter.  He also has a mildly enlarged right level II lymph node. Based on the above findings, the decision was made for the patient to undergo the above stated procedure.  The risks, benefits, alternatives, and details of the procedure were discussed with the patient.  Questions were invited and answered.  Informed consent was obtained.   DESCRIPTION:  The patient was taken to the operating room and placed supine on the operating table.  General endotracheal tube anesthesia was administered by the anesthesiologist. The patient was positioned and prepped and draped in the standard fashion for oral surgery.    A mouth gag was used to expose oral cavity. 1% lidocaine with 1 100,000 epinephrine was infiltrated at the right lateral tongue.  A nearly 2cm ulcerative mass was noted on the right lateral tongue.  Using a Bovie electrocautery device, the entire ulcerative mass was excised.  The excision diameter was approximately 3 cm in diameter.  Circumferential and deep margins were also obtained.  The margin specimens were sent to the pathology department for frozen section analysis.  They  were all negative for malignancy.  The surgical defect was closed in layers with 4-0 Vicryl sutures.  Attention was then focused on the right neck dissection.  1% lidocaine with 1-100,000 epinephrine was infiltrated at the planned site of incision.  A standard hockey-stick right lateral neck incision was made. The incision was carried down to the level of the platysma muscles.  Superiorly based subplatysmal flap was elevated in the standard fashion.  A selective right level 2-4 neck dissection was performed. The fascia was divided inferior to the submandibular gland and the posterior belly of the submandibular  gland was identified.  This was traced posteriorly to the sternocleidomastoid muscle, dividing the facial vein and reflecting the tail of parotid superiorly.  Sternocleidomastoid fascia was then incised and rolled medially, and midline fascia was reflected laterally off of the strap muscles. We then worked medially along the sternocleidomastoid muscle to identify the accessory nerve.  This was followed to the internal jugular vein.  The accessory nerve was carefully skeletonized and area IIb nodes were passed under the nerve and kept with the main specimen.  We then identified cervical rootlets and followed these superomedially to the carotid sheath.  We then skeletonized the omohyoid muscle and reflected this inferiorly. The lateral border of the internal jugular vein was identified and the packet was divided superior. The packet was then rolled out of level III and IV and off of the carotid sheath structures  and was sent to pathology for review.   The wound was irrigated and hemostasis was ensured.  A #10 JP drain was placed.  The neck wound was closed in layered fashion with 4-0 Vicryl sutures and Dermabond.    The care of the patient was turned over to the anesthesiologist.  The patient was awakened from anesthesia without difficulty.  The patient was extubated and transferred to the recovery room  in good condition.   OPERATIVE FINDINGS:  A 2cm right lateral tongue SCCA.   SPECIMEN: Right lateral tongue specimen and margins, right neck dissection specimens.   FOLLOWUP CARE:  The patient will be admitted for overnight observation.  Lakeisa Heninger Cassie Freer 07/30/2020 12:25 PM

## 2020-07-30 NOTE — Progress Notes (Signed)
Pt arrived to room 6N05 via stretcher after surgery. Pt ambulated from stretcher to bed. Received report from [place], [place] in PACU. See assessment. Will continue to monitor.

## 2020-07-31 DIAGNOSIS — C029 Malignant neoplasm of tongue, unspecified: Secondary | ICD-10-CM | POA: Diagnosis not present

## 2020-07-31 LAB — GLUCOSE, CAPILLARY
Glucose-Capillary: 176 mg/dL — ABNORMAL HIGH (ref 70–99)
Glucose-Capillary: 285 mg/dL — ABNORMAL HIGH (ref 70–99)

## 2020-07-31 MED ORDER — AMOXICILLIN 875 MG PO TABS: 875.0000 mg | ORAL_TABLET | Freq: Two times a day (BID) | ORAL | 0 refills | Status: AC

## 2020-07-31 MED ORDER — OXYCODONE-ACETAMINOPHEN 5-325 MG PO TABS: 1.0000 | ORAL_TABLET | ORAL | 0 refills | Status: AC | PRN

## 2020-07-31 NOTE — Progress Notes (Addendum)
[name] to be D/C'd  per MD order.  Discussed with the patient and all questions fully answered.  VSS, Skin clean, dry and intact without evidence of skin break down, no evidence of skin tears noted.  IV catheter discontinued intact. Site without signs and symptoms of complications. Dressing and pressure applied.  An After Visit Summary was printed and given to the patient. Patient received prescription.  D/c education completed with patient/family including follow up instructions, medication list, d/c activities limitations if indicated, with other d/c instructions as indicated by MD - patient able to verbalize understanding, all questions fully answered.   Patient instructed to return to ED, call 911, or call MD for any changes in condition.   Patient to be escorted via [facility], and D/C home via private auto.

## 2020-07-31 NOTE — Discharge Summary (Signed)
Physician Discharge Summary  Patient ID: Shawn Rhodes MRN: [id number] DOB/AGE: [id number] 47 y.o.  Admit date: 07/30/2020 Discharge date: 07/31/2020  Admission Diagnoses: Right tongue cancer  Discharge Diagnoses: Right tongue cancer Active Problems:   S/P glossectomy   Discharged Condition: good  Hospital Course: Pt had an uneventful overnight stay. Pt tolerated po well. No bleeding. No stridor.   Consults: None  Significant Diagnostic Studies: None  Treatments: surgery: Partial glossectomy and right neck dissection  Discharge Exam: Blood pressure 112/66, pulse 76, temperature (!) 97.4 F (36.3 C), temperature source Oral, resp. rate 17, height '5\' 11"'$  (1.803 m), weight 67.5 kg, SpO2 100 %. General appearance: alert and cooperative Head: Normocephalic, without obvious abnormality, atraumatic Throat: Tongue incision is clean and intact Incision/Wound: c/d/i  Disposition: Home  Discharge Instructions     Activity as tolerated - No restrictions   Complete by: As directed    Diet general   Complete by: As directed    No wound care   Complete by: As directed       Allergies as of 07/31/2020   No Known Allergies      Medication List     TAKE these medications    amoxicillin 875 MG tablet Commonly known as: AMOXIL Take 1 tablet (875 mg total) by mouth 2 (two) times daily for 3 days.   Lantus SoloStar 100 UNIT/ML Solostar Pen Generic drug: insulin glargine Inject 8-10 Units into the skin 2 (two) times daily.   lisinopril 5 MG tablet Commonly known as: ZESTRIL Take 5 mg by mouth daily.   NovoLOG FlexPen 100 UNIT/ML FlexPen Generic drug: insulin aspart Inject 1-15 Units into the skin 3 (three) times daily with meals.   oxyCODONE-acetaminophen 5-325 MG tablet Commonly known as: Percocet Take 1 tablet by mouth every 4 (four) hours as needed for up to 5 days for severe pain. What changed:  when to take this reasons to take this        Follow-up  Information     [name], MD Follow up on 08/05/2020.   Specialty: Otolaryngology Why: at 1:20pm Contact information: [address] [place]  [address] [phone]                 Signed: Burley Saver 07/31/2020, 10:56 AM

## 2020-08-04 LAB — SURGICAL PATHOLOGY

## 2020-08-09 ENCOUNTER — Other Ambulatory Visit: Payer: Self-pay | Admitting: Otolaryngology

## 2020-08-11 ENCOUNTER — Encounter (HOSPITAL_COMMUNITY): Payer: Self-pay | Admitting: Otolaryngology

## 2020-08-11 NOTE — Progress Notes (Signed)
Oncology Nurse Navigator Documentation   Placed introductory call to new referral patient Shawn Rhodes. Introduced myself as the H&N oncology nurse navigator that works with Dr. Isidore Moos to whom he has been referred by Dr. Benjamine Mola.  He confirmed understanding of referral. Briefly explained my role as his navigator, provided my contact information.  Confirmed understanding of upcoming appts and [place] location, explained arrival and registration process. I explained the purpose of a dental evaluation prior to starting RT, indicated he would be contacted by [facility] to arrange an appt.   I encouraged him to call with questions/concerns as he moves forward with appts and procedures.   He verbalized understanding of information provided, expressed appreciation for my call.   Navigator Initial Assessment Employment Status: he is self-employed.  Currently on FMLA / STD: no Living Situation: he lives alone Support System: mom, family PCP: Shirline Frees MD PCD: Nicki Reaper Minor DDS Financial Concerns: not at this time.  Transportation Needs: no Sensory Deficits: no Engineer, building services Needed:  no Ambulation Needs: no DME Used in Home: no Psychosocial Needs:  no Concerns/Needs Understanding Cancer:  addressed/answered by navigator to best of ability Self-Expressed Needs: no   Harlow Asa RN, BSN, OCN Head & Neck Oncology Nurse [place] [facility] Phone # [phone]  Fax # [phone]

## 2020-08-11 NOTE — Progress Notes (Addendum)
Shawn Rhodes denies chest pain or shortness of breath. Shawn Rhodes denies having any s/s of Covid in his household.  Shawn Rhodes denies any known exposure to Covid.   Shawn. [name] has type I diabetes.  Shawn Rhodes reports that he checks CBG 4 - 5 times a day.  I instructed Shawn. Jankiewicz to decrease Lantus insulin by 80% tonight and if CBG is greater than 70 take 80% of scheduled dose.  I instructed Shawn Rhodes to check CBG after awaking and every 2 hours until arrival  to the hospital.  I Instructed Shawn Rhodes if CBG is less than 70 to take 4 Glucose Tablets or 1 tube of Glucose Gel or 1/2 cup of a clear juice. Recheck CBG in 15 minutes if CBG is not over 70 call, pre- op desk at [phone] for further instructions. If scheduled to receive Insulin, do not take Insulin.  I instructedMr. McKinneyto shower with antibiotic soap, if it is available.  Dry off with a clean towel. Do not put lotion, powder, cologne or deodorant or makeup.No jewelry or piercings. Men may shave their face and neck. Woman should not shave. No nail polish, artificial or acrylic nails. Wear clean clothes, brush your teeth. Glasses, contact lens,dentures or partials may not be worn in the OR. If you need to wear them, please bring a case for glasses, do not wear contacts or bring a case, the hospital does not have contact cases, dentures or partials will have to be removed , make sure they are clean, we will provide a denture cup to put them in. You will need some one to drive you home and a responsible person over the age of 14 to stay with you for the first 24 hours after surgery.

## 2020-08-12 ENCOUNTER — Other Ambulatory Visit: Payer: Self-pay

## 2020-08-12 ENCOUNTER — Ambulatory Visit (HOSPITAL_COMMUNITY)
Admission: RE | Admit: 2020-08-12 | Discharge: 2020-08-12 | Disposition: A | Discharge status: Home / Self Care | Payer: 59 | Attending: Otolaryngology | Admitting: Otolaryngology

## 2020-08-12 ENCOUNTER — Ambulatory Visit (HOSPITAL_COMMUNITY): Payer: 59

## 2020-08-12 ENCOUNTER — Encounter (HOSPITAL_COMMUNITY)
Admission: RE | Disposition: A | Discharge status: Home / Self Care | Payer: Self-pay | Source: Home / Self Care | Attending: Otolaryngology

## 2020-08-12 ENCOUNTER — Encounter (HOSPITAL_COMMUNITY): Payer: Self-pay | Admitting: Otolaryngology

## 2020-08-12 DIAGNOSIS — Z9889 Other specified postprocedural states: Secondary | ICD-10-CM | POA: Diagnosis not present

## 2020-08-12 DIAGNOSIS — C021 Malignant neoplasm of border of tongue: Secondary | ICD-10-CM | POA: Diagnosis not present

## 2020-08-12 DIAGNOSIS — Z794 Long term (current) use of insulin: Secondary | ICD-10-CM | POA: Insufficient documentation

## 2020-08-12 DIAGNOSIS — E119 Type 2 diabetes mellitus without complications: Secondary | ICD-10-CM | POA: Insufficient documentation

## 2020-08-12 LAB — GLUCOSE, CAPILLARY
Glucose-Capillary: 127 mg/dL — ABNORMAL HIGH (ref 70–99)
Glucose-Capillary: 137 mg/dL — ABNORMAL HIGH (ref 70–99)

## 2020-08-12 SURGERY — GLOSSECTOMY, PARTIAL
Anesthesia: General

## 2020-08-12 MED ORDER — DEXAMETHASONE SODIUM PHOSPHATE 10 MG/ML IJ SOLN
INTRAMUSCULAR | Status: DC | PRN
  Administered 2020-08-12: 4 mg via INTRAVENOUS

## 2020-08-12 MED ORDER — CHLORHEXIDINE GLUCONATE 0.12 % MT SOLN: 15.0000 mL | Freq: Once | OROMUCOSAL | Status: AC

## 2020-08-12 MED ORDER — OXYMETAZOLINE HCL 0.05 % NA SOLN
NASAL | Status: DC | PRN
  Administered 2020-08-12: 1 via TOPICAL

## 2020-08-12 MED ORDER — SUCCINYLCHOLINE CHLORIDE 200 MG/10ML IV SOSY
PREFILLED_SYRINGE | INTRAVENOUS | Status: AC
  Filled 2020-08-12: qty 10

## 2020-08-12 MED ORDER — PROMETHAZINE HCL 25 MG/ML IJ SOLN: 6.2500 mg | INTRAMUSCULAR | Status: DC | PRN

## 2020-08-12 MED ORDER — PHENYLEPHRINE HCL (PRESSORS) 10 MG/ML IV SOLN
INTRAVENOUS | Status: DC | PRN
  Administered 2020-08-12 (×2): 80 ug via INTRAVENOUS
  Administered 2020-08-12: 120 ug via INTRAVENOUS

## 2020-08-12 MED ORDER — ONDANSETRON HCL 4 MG/2ML IJ SOLN
INTRAMUSCULAR | Status: DC | PRN
Start: 2020-08-12 — End: 2020-08-12
  Administered 2020-08-12: 4 mg via INTRAVENOUS

## 2020-08-12 MED ORDER — ACETAMINOPHEN 160 MG/5ML PO SOLN: 325.0000 mg | ORAL | Status: DC | PRN

## 2020-08-12 MED ORDER — SUCCINYLCHOLINE CHLORIDE 200 MG/10ML IV SOSY
PREFILLED_SYRINGE | INTRAVENOUS | Status: DC | PRN
  Administered 2020-08-12: 100 mg via INTRAVENOUS

## 2020-08-12 MED ORDER — OXYCODONE HCL 5 MG/5ML PO SOLN: 5.0000 mg | Freq: Once | ORAL | Status: AC | PRN

## 2020-08-12 MED ORDER — BACITRACIN ZINC 500 UNIT/GM EX OINT
TOPICAL_OINTMENT | CUTANEOUS | Status: AC
  Filled 2020-08-12: qty 28.35

## 2020-08-12 MED ORDER — PROPOFOL 10 MG/ML IV BOLUS
INTRAVENOUS | Status: DC | PRN
  Administered 2020-08-12: 150 mg via INTRAVENOUS
  Administered 2020-08-12: 50 mg via INTRAVENOUS

## 2020-08-12 MED ORDER — FENTANYL CITRATE (PF) 100 MCG/2ML IJ SOLN
INTRAMUSCULAR | Status: DC | PRN
  Administered 2020-08-12 (×3): 50 ug via INTRAVENOUS

## 2020-08-12 MED ORDER — SUGAMMADEX SODIUM 200 MG/2ML IV SOLN
INTRAVENOUS | Status: DC | PRN
  Administered 2020-08-12: 400 mg via INTRAVENOUS

## 2020-08-12 MED ORDER — ROCURONIUM BROMIDE 10 MG/ML (PF) SYRINGE
PREFILLED_SYRINGE | INTRAVENOUS | Status: DC | PRN
  Administered 2020-08-12 (×2): 50 mg via INTRAVENOUS

## 2020-08-12 MED ORDER — MIDAZOLAM HCL 2 MG/2ML IJ SOLN
INTRAMUSCULAR | Status: AC
  Filled 2020-08-12: qty 2

## 2020-08-12 MED ORDER — AMISULPRIDE (ANTIEMETIC) 5 MG/2ML IV SOLN: 10.0000 mg | Freq: Once | INTRAVENOUS | Status: DC | PRN

## 2020-08-12 MED ORDER — MIDAZOLAM HCL 5 MG/5ML IJ SOLN
INTRAMUSCULAR | Status: DC | PRN
  Administered 2020-08-12: 2 mg via INTRAVENOUS

## 2020-08-12 MED ORDER — FENTANYL CITRATE (PF) 100 MCG/2ML IJ SOLN
INTRAMUSCULAR | Status: AC
  Filled 2020-08-12: qty 2

## 2020-08-12 MED ORDER — OXYMETAZOLINE HCL 0.05 % NA SOLN
NASAL | Status: AC
  Filled 2020-08-12: qty 30

## 2020-08-12 MED ORDER — OXYCODONE HCL 5 MG PO TABS
ORAL_TABLET | ORAL | Status: AC
  Filled 2020-08-12: qty 1

## 2020-08-12 MED ORDER — ONDANSETRON HCL 4 MG/2ML IJ SOLN
INTRAMUSCULAR | Status: AC
  Filled 2020-08-12: qty 2

## 2020-08-12 MED ORDER — ACETAMINOPHEN 325 MG PO TABS: 325.0000 mg | ORAL_TABLET | ORAL | Status: DC | PRN

## 2020-08-12 MED ORDER — LACTATED RINGERS IV SOLN: INTRAVENOUS | Status: DC

## 2020-08-12 MED ORDER — OXYCODONE-ACETAMINOPHEN 5-325 MG PO TABS: 1.0000 | ORAL_TABLET | ORAL | 0 refills | Status: AC | PRN

## 2020-08-12 MED ORDER — CEFAZOLIN SODIUM 1 G IJ SOLR
INTRAMUSCULAR | Status: AC
  Filled 2020-08-12: qty 20

## 2020-08-12 MED ORDER — ORAL CARE MOUTH RINSE: 15.0000 mL | Freq: Once | OROMUCOSAL | Status: AC

## 2020-08-12 MED ORDER — LIDOCAINE 2% (20 MG/ML) 5 ML SYRINGE
INTRAMUSCULAR | Status: DC | PRN
Start: 2020-08-12 — End: 2020-08-12
  Administered 2020-08-12: 100 mg via INTRAVENOUS

## 2020-08-12 MED ORDER — ACETAMINOPHEN 10 MG/ML IV SOLN
INTRAVENOUS | Status: AC
  Filled 2020-08-12: qty 100

## 2020-08-12 MED ORDER — OXYCODONE HCL 5 MG PO TABS
5.0000 mg | ORAL_TABLET | Freq: Once | ORAL | Status: AC | PRN
  Administered 2020-08-12: 5 mg via ORAL

## 2020-08-12 MED ORDER — PHENYLEPHRINE 40 MCG/ML (10ML) SYRINGE FOR IV PUSH (FOR BLOOD PRESSURE SUPPORT)
PREFILLED_SYRINGE | INTRAVENOUS | Status: AC
  Filled 2020-08-12: qty 10

## 2020-08-12 MED ORDER — ACETAMINOPHEN 10 MG/ML IV SOLN
1000.0000 mg | Freq: Once | INTRAVENOUS | Status: DC | PRN
  Administered 2020-08-12: 1000 mg via INTRAVENOUS

## 2020-08-12 MED ORDER — CEFAZOLIN SODIUM-DEXTROSE 2-3 GM-%(50ML) IV SOLR
INTRAVENOUS | Status: DC | PRN
  Administered 2020-08-12: 2 g via INTRAVENOUS

## 2020-08-12 MED ORDER — 0.9 % SODIUM CHLORIDE (POUR BTL) OPTIME
TOPICAL | Status: DC | PRN
  Administered 2020-08-12: 1000 mL

## 2020-08-12 MED ORDER — FENTANYL CITRATE (PF) 100 MCG/2ML IJ SOLN: 25.0000 ug | INTRAMUSCULAR | Status: DC | PRN

## 2020-08-12 MED ORDER — DEXMEDETOMIDINE HCL IN NACL 200 MCG/50ML IV SOLN
INTRAVENOUS | Status: DC | PRN
  Administered 2020-08-12: 12 ug via INTRAVENOUS

## 2020-08-12 MED ORDER — AMOXICILLIN 400 MG/5ML PO SUSR: 800.0000 mg | Freq: Two times a day (BID) | ORAL | 0 refills | Status: AC

## 2020-08-12 MED ORDER — CHLORHEXIDINE GLUCONATE 0.12 % MT SOLN
OROMUCOSAL | Status: AC
  Administered 2020-08-12: 15 mL via OROMUCOSAL
  Filled 2020-08-12: qty 15

## 2020-08-12 MED ORDER — FENTANYL CITRATE (PF) 250 MCG/5ML IJ SOLN
INTRAMUSCULAR | Status: AC
  Filled 2020-08-12: qty 5

## 2020-08-12 MED ORDER — SUGAMMADEX SODIUM 500 MG/5ML IV SOLN
INTRAVENOUS | Status: AC
  Filled 2020-08-12: qty 5

## 2020-08-12 SURGICAL SUPPLY — 49 items
ATTRACTOMAT 16X20 MAGNETIC DRP (DRAPES) IMPLANT
BAG COUNTER SPONGE SURGICOUNT (BAG) ×2 IMPLANT
BLADE CLIPPER SURG (BLADE) IMPLANT
BLADE SURG 15 STRL LF DISP TIS (BLADE) IMPLANT
BLADE SURG 15 STRL SS (BLADE)
BNDG GAUZE ELAST 4 BULKY (GAUZE/BANDAGES/DRESSINGS) ×2 IMPLANT
CANISTER SUCT 3000ML PPV (MISCELLANEOUS) ×2 IMPLANT
CLEANER TIP ELECTROSURG 2X2 (MISCELLANEOUS) ×2 IMPLANT
CNTNR URN SCR LID CUP LEK RST (MISCELLANEOUS) ×1 IMPLANT
CONT SPEC 4OZ STRL OR WHT (MISCELLANEOUS) ×1
COVER SURGICAL LIGHT HANDLE (MISCELLANEOUS) ×2 IMPLANT
DERMACARRIERS GRAFT 1 TO 1.5 (DISPOSABLE) IMPLANT
DRAIN CHANNEL 10F 3/8 F FF (DRAIN) IMPLANT
DRAIN SNY 7 FPER (WOUND CARE) IMPLANT
DRAPE HALF SHEET 40X57 (DRAPES) IMPLANT
ELECT COATED BLADE 2.86 ST (ELECTRODE) ×2 IMPLANT
ELECT REM PT RETURN 9FT ADLT (ELECTROSURGICAL) ×2 IMPLANT
ELECTRODE REM PT RTRN 9FT ADLT (ELECTROSURGICAL) ×1 IMPLANT
EVACUATOR SILICONE 100CC (DRAIN) IMPLANT
GAUZE 4X4 16PLY ~~LOC~~+RFID DBL (SPONGE) ×2 IMPLANT
GLOVE SURG LTX SZ7.5 (GLOVE) ×4 IMPLANT
GOWN STRL REUS W/ TWL LRG LVL3 (GOWN DISPOSABLE) ×2 IMPLANT
GOWN STRL REUS W/TWL LRG LVL3 (GOWN DISPOSABLE) ×2
GRAFT DERMACARRIERS 1 TO 1.5 (DISPOSABLE) IMPLANT
KIT BASIN OR (CUSTOM PROCEDURE TRAY) ×2 IMPLANT
KIT TURNOVER KIT B (KITS) ×2 IMPLANT
LOCATOR NERVE 3 VOLT (DISPOSABLE) IMPLANT
NEEDLE HYPO 25GX1X1/2 BEV (NEEDLE) IMPLANT
NS IRRIG 1000ML POUR BTL (IV SOLUTION) ×2 IMPLANT
PAD ARMBOARD 7.5X6 YLW CONV (MISCELLANEOUS) ×4 IMPLANT
PENCIL SMOKE EVACUATOR (MISCELLANEOUS) ×2 IMPLANT
ROLLS DENTAL (MISCELLANEOUS) IMPLANT
SPECIMEN JAR MEDIUM (MISCELLANEOUS) ×2 IMPLANT
SPONGE INTESTINAL PEANUT (DISPOSABLE) IMPLANT
SPONGE T-LAP 18X18 ~~LOC~~+RFID (SPONGE) ×2 IMPLANT
SURGILUBE 2OZ TUBE FLIPTOP (MISCELLANEOUS) IMPLANT
SUT SILK 2 0 PERMA HAND 18 BK (SUTURE) ×2 IMPLANT
SUT SILK 2 0 REEL (SUTURE) ×2 IMPLANT
SUT SILK 3 0 REEL (SUTURE) ×2 IMPLANT
SUT VIC AB 3-0 FS2 27 (SUTURE) ×2 IMPLANT
SUT VIC AB 4-0 SH 27 (SUTURE) ×2
SUT VIC AB 4-0 SH 27XBRD (SUTURE) ×2 IMPLANT
SYR 5ML LL (SYRINGE) IMPLANT
SYR BULB EAR ULCER 3OZ GRN STR (SYRINGE) ×2 IMPLANT
TOWEL GREEN STERILE FF (TOWEL DISPOSABLE) ×2 IMPLANT
TRAY ENT MC OR (CUSTOM PROCEDURE TRAY) ×2 IMPLANT
TRAY FOLEY MTR SLVR 14FR STAT (SET/KITS/TRAYS/PACK) IMPLANT
TUBE CONNECTING 12X1/4 (SUCTIONS) ×2 IMPLANT
TUBE FEEDING 10FR FLEXIFLO (MISCELLANEOUS) IMPLANT

## 2020-08-12 NOTE — Interval H&P Note (Signed)
History and Physical Interval Note:  08/12/2020 1:07 PM  Shawn Rhodes  has presented today for surgery, with the diagnosis of TONGUE CANCER with positive margin.  The various methods of treatment have been discussed with the patient and family. After consideration of risks, benefits and other options for treatment, the patient has consented to  Procedure(s): REVISION PARTIAL GLOSSECTOMY (N/A) as a surgical intervention.  The patient's history has been reviewed, patient examined, no change in status, stable for surgery.  I have reviewed the patient's chart and labs.  Questions were answered to the patient's satisfaction.     Edgardo Petrenko Cassie Freer

## 2020-08-12 NOTE — Anesthesia Preprocedure Evaluation (Addendum)
Anesthesia Evaluation  Patient identified by MRN, date of birth, ID band Patient awake    Reviewed: Allergy & Precautions, NPO status , Patient's Chart, lab work & pertinent test results  Airway Mallampati: I  TM Distance: >3 FB Neck ROM: Full    Dental  (+) Teeth Intact, Dental Advisory Given   Pulmonary neg pulmonary ROS,    breath sounds clear to auscultation       Cardiovascular hypertension, Pt. on medications  Rhythm:Regular Rate:Normal     Neuro/Psych negative neurological ROS  negative psych ROS   GI/Hepatic negative GI ROS, Neg liver ROS,   Endo/Other  diabetes, Type 2, Insulin Dependent  Renal/GU negative Renal ROS     Musculoskeletal negative musculoskeletal ROS (+)   Abdominal Normal abdominal exam  (+)   Peds  Hematology negative hematology ROS (+)   Anesthesia Other Findings   Reproductive/Obstetrics                            Anesthesia Physical Anesthesia Plan  ASA: 2  Anesthesia Plan: General   Post-op Pain Management:    Induction: Intravenous  PONV Risk Score and Plan: 3 and Ondansetron, Dexamethasone and Midazolam  Airway Management Planned: Video Laryngoscope Planned and Oral ETT  Additional Equipment: None  Intra-op Plan:   Post-operative Plan: Extubation in OR  Informed Consent: I have reviewed the patients History and Physical, chart, labs and discussed the procedure including the risks, benefits and alternatives for the proposed anesthesia with the patient or authorized representative who has indicated his/her understanding and acceptance.       Plan Discussed with: CRNA  Anesthesia Plan Comments:        Anesthesia Quick Evaluation

## 2020-08-12 NOTE — Transfer of Care (Signed)
Immediate Anesthesia Transfer of Care Note  Patient: Shawn Rhodes  Procedure(s) Performed: REVISION PARTIAL GLOSSECTOMY  Patient Location: PACU  Anesthesia Type:General  Level of Consciousness: awake, alert  and oriented  Airway & Oxygen Therapy: Patient Spontanous Breathing and Patient connected to nasal cannula oxygen  Post-op Assessment: Report given to RN and Post -op Vital signs reviewed and stable  Post vital signs: Reviewed and stable  Last Vitals:  Vitals Value Taken Time  BP 125/67 08/12/20 1712  Temp    Pulse 88 08/12/20 1715  Resp 16 08/12/20 1715  SpO2 97 % 08/12/20 1715  Vitals shown include unvalidated device data.  Last Pain:  Vitals:   08/12/20 1304  TempSrc:   PainSc: 3       Patients Stated Pain Goal: 0 ([id number])  Complications: No notable events documented.

## 2020-08-12 NOTE — Progress Notes (Signed)
Head and Neck Cancer Location of Tumor / Histology: Squamous cell carcinoma of RIGHT side of tongue, p16(-)  Patient presented with symptoms of: a non healing right lateral tongue ulcer.  It was approximately 1 cm in diameter.  He underwent biopsy of the lesion in [Dr. Teoh's office] at the last visit.  The pathology was consistent with squamous cell carcinoma.  He subsequently underwent a neck CT scan.  The CT showed a focus of ulceration and abnormal mucosal enhancement along the right aspect of the tongue, measuring 1.5 cm in diameter.  He also has a mildly enlarged right level II lymph node  Biopsies revealed:  07/30/2020 FINAL MICROSCOPIC DIAGNOSIS:  A. TONGUE, ANTERIOR SUPERIOR MARGIN, BIOPSY:  -  No carcinoma identified  B. TONGUE, ANTERIOR INFERIOR MARGIN, BIOPSY:  -  No carcinoma identified  C. TONGUE, POSTERIOR SUPERIOR MARGIN, BIOPSY:  -  No carcinoma identified  D. TONGUE, POSTERIOR INFERIOR MARGIN, BIOPSY:  -  No carcinoma identified  E. TONGUE, DEEP MARGIN, BIOPSY:  -  Invasive squamous cell carcinoma involving skeletal muscle  -  See comment  F. TONGUE, RIGHT, PARTIAL GLOSSECTOMY:  -  Invasive squamous cell carcinoma, 2.4 cm  -  Carcinoma extends to the deep margin  -  See oncology template and comment below  G. LYMPH NODES, RIGHT NECK, DISSECTION:  -  No carcinoma identified in nine lymph nodes (0/9)  ADDENDUM:  P16 immunohistochemistry is NEGATIVE.  EBV by in situ hybridization is NEGATIVE.  Nutrition Status Yes No Comments  Weight changes? '[x]'$  '[]'$  Reports unintentional weight loss prior to surgery of about +10 lbs due to difficulty eating  Swallowing concerns? '[]'$  '[]'$  Denies any concerns currently; Reports diffculty eating/swallowing prior to recent suregery   PEG? '[]'$  '[x]'$     Referrals Yes No Comments  Social Work? '[x]'$  '[]'$    Dentistry? '[x]'$  '[]'$    Swallowing therapy? '[x]'$  '[]'$    Nutrition? '[x]'$  '[]'$    Med/Onc? '[]'$  '[x]'$     Safety Issues Yes No Comments  Prior radiation? '[]'$   '[x]'$    Pacemaker/ICD? '[]'$  '[x]'$    Possible current pregnancy? '[]'$  '[x]'$  N/A  Is the patient on methotrexate? '[]'$  '[x]'$     Tobacco/Marijuana/Snuff/ETOH use: Patient has never smoked or used smokeless tobacco. Denies any recreational drug use. Drinks alcohol occasionally  Past/Anticipated interventions by otolaryngology, if any:  07/30/2020 Dr. [name] Right partial glossectomy with right neck dissection.  Past/Anticipated interventions by medical oncology, if any:  No referral placed at this time   Current Complaints / other details:  Nothing else of note

## 2020-08-12 NOTE — Discharge Instructions (Signed)
The patient may resume all his previous activities.  He may advance his diet as tolerated.  The patient will follow up in my office in 1 week.

## 2020-08-12 NOTE — Anesthesia Procedure Notes (Signed)
Procedure Name: Intubation Date/Time: 08/12/2020 3:23 PM Performed by: Annamary Carolin, CRNA Pre-anesthesia Checklist: Patient identified, Emergency Drugs available, Suction available, Patient being monitored and Timeout performed Patient Re-evaluated:Patient Re-evaluated prior to induction Oxygen Delivery Method: Circle system utilized Preoxygenation: Pre-oxygenation with 100% oxygen Induction Type: IV induction Ventilation: Mask ventilation without difficulty Laryngoscope Size: Glidescope and 4 Grade View: Grade I Tube type: Oral Tube size: 7.5 mm Number of attempts: 1 Airway Equipment and Method: Video-laryngoscopy Placement Confirmation: ETT inserted through vocal cords under direct vision and positive ETCO2 Secured at: 22 cm Tube secured with: Tape Dental Injury: Teeth and Oropharynx as per pre-operative assessment  Comments: Intubated by Everlene Other SRNA

## 2020-08-12 NOTE — Op Note (Signed)
DATE OF PROCEDURE:  08/12/2020                              OPERATIVE REPORT  SURGEON:  [name], MD  PREOPERATIVE DIAGNOSES: 1.  Right lateral tongue squamous cell carcinoma.   POSTOPERATIVE DIAGNOSES: 1.  Right lateral tongue squamous of carcinoma   PROCEDURE PERFORMED: Revision right partial glossectomy   ANESTHESIA:  General endotracheal tube anesthesia.   COMPLICATIONS:  None.   ESTIMATED BLOOD LOSS: 150 ml   INDICATION FOR PROCEDURE: Shawn Rhodes is a 45 y.o. male with a history of a right lateral tongue squamous cell carcinoma.  He underwent right partial glossectomy and right neck dissection and 1 week ago.  His glossectomy frozen section margins were all negative intraoperatively.  However, on permanent sections, foci of squamous cell carcinoma was noted on the deep margin.  Based on the above findings, the decision was made for the patient to undergo the above stated procedure, in order to obtain a clear margin and avoid postop chemotherapy.  The risks, benefits, alternatives, and details of the procedure were discussed with the patient.  Questions were invited and answered.  Informed consent was obtained.   DESCRIPTION:  The patient was taken to the operating room and placed supine on the operating table.  General endotracheal tube anesthesia was administered by the anesthesiologist. The patient was positioned and prepped and draped in the standard fashion for oral surgery.     A mouth gag was used to expose oral cavity.  The surgical sutures were removed from the glossectomy surgical site.  Examination of the surgical site showed a layer of granulation tissue. A layer of partial glossectomy specimen was obtained from the deep margin, and sent to the pathology marked as deep margin layer 1.  Another deeper layer was obtained and sent to the pathology department marked as deep layer 2. The frozen section from deep layers 1&2 showed scattered cellular atypia without frank carcinoma.   The decision was made to obtain a third layer, and sent to the pathology department marked as deep layer #3.  The frozen section analysis of deep layer #3 was clear of cellular atypia or carcinoma.  The surgical defect was closed in layers with 4-0 Vicryl sutures.    The care of the patient was turned over to the anesthesiologist.  The patient was awakened from anesthesia without difficulty.  The patient was extubated and transferred to the recovery room in good condition.   OPERATIVE FINDINGS:  Partial left glossectomy with negative deep margin.   SPECIMEN: Deep margins from the left partial glossectomy specimen.   FOLLOWUP CARE:  The patient will be discharged home when awake and alert.   Katrenia Alkins W Sharika Mosquera 08/12/2020 5:07 PM

## 2020-08-15 NOTE — Progress Notes (Signed)
Radiation Oncology         [phone]) [phone] ________________________________  Initial Outpatient Consultation  Name: Shawn Rhodes MRN: [id number]  Date: 08/16/2020  DOB: 10/15/1975  GO:[id number], Shawn Saxon, MD  [name], MD   REFERRING PHYSICIAN: [name], MD  DIAGNOSIS:    ICD-10-CM   1. Squamous cell carcinoma of lateral tongue (HCC)  C02.1 Ambulatory referral to Social Work     Cancer Staging Squamous cell carcinoma of lateral tongue ([place]) Staging form: Oral Cavity, AJCC 8th Edition - Pathologic stage from 08/16/2020: Stage III (pT3, pN0, cM0) - Signed by Eppie Gibson, MD on 08/19/2020 Stage prefix: Initial diagnosis Histologic grade (G): G2 Histologic grading system: 3 grade system  Right lateral tongue squamous cell carcinoma   CHIEF COMPLAINT: Here to discuss management of right tongue cancer  HISTORY OF PRESENT ILLNESS::Shawn Rhodes is a 45 y.o. male who presented with a history of a non healing right lateral tongue ulcer; measuring 1 cm in diameter.  Subsequently, the patient met with Dr. Benjamine Mola who performed a biopsy of the lesion in office revealing findings consistent with squamous cell carcinoma.   Diagnostic neck CT on 07/04/20 demonstrated a focus of ulceration and abnormal mucosal enhancement along the right aspect of the tongue; measuring 15 x 10 mm, noted to be possibly reflective of the reported tongue malignancy. Findings also showed a mildly enlarged right level 2 lymph node; measuring 13 mm in the short axis (nodal metastatic disease was noted to not be excluded).  I have spoken personally with Dr. Benjamine Mola about his case.  Dr. Benjamine Mola reports that the tumor showed aggressive behavior between initial diagnosis and time of resection, growing significantly.  Subsequently, the patient underwent partial right glossectomy on 07/30/20 revealing: invasive squamous cell carcinoma; measuring 2.4 cm; with carcinoma extending to the deep margin (skeletal muscle). Posterior and anterior  margins and right nodal biopsies were all negative for carcinoma.  Perineural invasion present.  P16 and EBV negative..  9 lymph nodes from right neck negative  Pertinent imaging thus far includes: PET performed on 07/22/20 revealing a mildly enlarged right sided level II cervical node identified on recent diagnostic neck CT which showed a low level hypermetabolism concerning for metastatic involvement.  Also seen were two areas of clustered peripheral micro nodularity identified in the lungs; 1 in the lateral right mid lung and the other in the posterior left lower lobe. The posterior left lower lobe disease shows low level FDG uptake. Both were noted to likely be infectious/inflammatory , with atypical infection not ruled out.    The patient underwent right partial glossectomy revision on 08/12/20 due to squamous cell carcinoma being present in the deep margins from prior procedure, and in order to obtain clear margins and avoid post-op chemotherapy.  Report pending.  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  He is here with his mother today.  PREVIOUS RADIATION THERAPY: No  PAST MEDICAL HISTORY:  has a past medical history of Cancer ([place]) and Diabetes mellitus without complication ([place]).    PAST SURGICAL HISTORY: Past Surgical History:  Procedure Laterality Date   PARTIAL GLOSSECTOMY  07/30/2020   with right neck dissection  WISDOM TOOTH EXTRACTION      FAMILY HISTORY: family history is not on file.  SOCIAL HISTORY:  reports that he has never smoked. He has never used smokeless tobacco. He reports current alcohol use. He reports that he does not use drugs.  ALLERGIES:  Patient has no known allergies.  MEDICATIONS:  Current Outpatient Medications  Medication Sig Dispense Refill   insulin aspart (NOVOLOG FLEXPEN) 100 UNIT/ML FlexPen Inject 1-15 Units into the skin 3 (three) times daily with meals.     insulin glargine (LANTUS SOLOSTAR) 100 UNIT/ML Solostar Pen Inject 8-10 Units into the skin 2 (two) times daily.     lisinopril (ZESTRIL) 5 MG tablet Take 5 mg by mouth daily.     No current facility-administered medications for this encounter.    REVIEW OF SYSTEMS:  Notable for that above.   PHYSICAL EXAM:  height is 5' 11"  (1.803 m) and weight is 156 lb (70.8 kg). His temporal temperature is 97 F (36.1 C) (abnormal). His blood pressure is 148/91 (abnormal) and his pulse is 84. His respiration is 18 and oxygen saturation is 100%.   General: Alert and oriented, in no acute distress HEENT: Head is normocephalic. Extraocular movements are intact.  No oral thrush.  Oropharynx is notable for no lesions in upper throat.  Oral cavity notable for tissue deficit of mid to posterior lateral right oral tongue at site of resection. Neck: Neck is notable for healing right neck scar from recent neck dissection.  No palpable masses Heart: Regular in rate and rhythm with no murmurs, rubs, or gallops. Chest: Clear to auscultation bilaterally, with no rhonchi, wheezes, or rales. Abdomen: Soft, nontender, nondistended, with no rigidity or guarding. Extremities: No cyanosis or edema. Lymphatics: see Neck Exam Skin: No concerning lesions. Musculoskeletal: symmetric strength and muscle tone throughout. Neurologic: Cranial nerves II through XII are grossly intact. No obvious focalities. Speech is fluent. Coordination is intact. Psychiatric: Judgment and insight are intact. Affect is appropriate.   ECOG = 0  0 - Asymptomatic (Fully active, able to carry on all predisease activities without restriction)  1 - Symptomatic but completely ambulatory (Restricted in physically  strenuous activity but ambulatory and able to carry out work of a light or sedentary nature. For example, light housework, office work)  2 - Symptomatic, <50% in bed during the day (Ambulatory and capable of all self care but unable to carry out any work activities. Up and about more than 50% of waking hours)  3 - Symptomatic, >50% in bed, but not bedbound (Capable of only limited self-care, confined to bed or chair 50% or more of waking hours)  4 - Bedbound (Completely disabled. Cannot carry on any self-care. Totally confined to bed or chair)  5 - Death   Eustace Pen MM, Creech RH, Tormey DC, et al. [phone]). "Toxicity and response criteria of the [facility]". [place] Oncol. 5 (6): 649-55   LABORATORY DATA:  Lab Results  Component Value Date   WBC 6.5 07/28/2020   HGB 13.4 07/28/2020   HCT 38.7 (L) 07/28/2020   MCV 88.4 07/28/2020   PLT 365 07/28/2020   CMP     Component Value Date/Time   NA 137 07/28/2020 1034   K 4.2 07/28/2020 1034   CL 104 07/28/2020 1034   CO2 26 07/28/2020 1034   GLUCOSE 256 (H) 07/28/2020 1034   BUN 9 07/28/2020 1034   CREATININE 0.91 07/28/2020 1034   CALCIUM 9.1 07/28/2020 1034   GFRNONAA >60  07/28/2020 1034      No results found for: TSH   RADIOGRAPHY: NM PET Image Initial (PI) Skull Base To Thigh  Result Date: 07/25/2020 CLINICAL DATA:  Initial treatment strategy for head and neck cancer. EXAM: NUCLEAR MEDICINE PET SKULL BASE TO THIGH TECHNIQUE: 8.2 mCi F-18 FDG was injected intravenously. Full-ring PET imaging was performed from the skull base to thigh after the radiotracer. CT data was obtained and used for attenuation correction and anatomic localization. Fasting blood glucose: 169 mg/dl COMPARISON:  Neck CT 07/04/2020 FINDINGS: Mediastinal blood pool activity: SUV max 1.8 Liver activity: SUV max NA NECK: Focal hypermetabolism identified along the right aspect of the tongue, in the region of the abnormal enhancement seen on  recent neck CT. SUV max = 5.6. Asymmetric uptake noted in the submandibular glands, nonspecific. The right level II lymph node identified on recent diagnostic neck CT as mildly enlarged shows low level hypermetabolism today with SUV max = 2.9. Incidental CT findings: none CHEST: No hypermetabolic mediastinal or hilar nodes.Subtle cluster of peripheral micro nodularity noted right lung (image 76/4). Similar clustered micro nodularity noted posterior left lower lobe on image 84/4 with low level FDG accumulation. Fine detail obscured by motion on this non breath hold study. Incidental CT findings: Esophagus is diffusely patulous without evidence of wall thickening. Coronary artery calcification is evident. ABDOMEN/PELVIS: No abnormal hypermetabolic activity within the liver, pancreas, adrenal glands, or spleen. No hypermetabolic lymph nodes in the abdomen or pelvis. Focal hypermetabolism identified in the right hemiscrotum with SUV max = 12.0. Incidental CT findings: none SKELETON: No focal hypermetabolic activity to suggest skeletal metastasis. Incidental CT findings: No worrisome lytic or sclerotic osseous abnormality. IMPRESSION: 1. Focal hypermetabolism identified in the right tongue consistent with the patient's known primary lesion. 2. Mildly enlarged right-sided level II cervical node identified on recent diagnostic neck CT shows low level hypermetabolism, concerning for metastatic involvement. 3. Two areas of clustered peripheral micro nodularity identified in the lungs, 1 in the lateral right mid lung and the other in the posterior left lower lobe. Posterior left lower lobe disease shows low level FDG uptake. Both are probably infectious/inflammatory and atypical infection is a consideration. Consider dedicated CT chest to establish baseline for follow-up as today's CT imaging is a non breath hold exam which obscures fine detail. 4. Focal hypermetabolism in the right hemiscrotum. Correlation with physical exam  recommended to exclude testicular mass lesion. 5. Diffusely patulous esophagus. CT imaging features suggest dysmotility or distal esophageal stricture. There is layering fluid and debris in the esophageal lumen. No focal hypermetabolism in the distal esophagus on PET imaging 6. Coronary artery atherosclerosis. Electronically Signed   By: Misty Stanley M.D.   On: 07/25/2020 07:16      IMPRESSION/PLAN:  This is a delightful patient with head and neck cancer.  He has a history of an aggressive oral tongue tumor with perineural invasion and positive margins.  Final margin status is pending report after attempted clearance of margins.   Given the T3 pathologic stage and perineural invasion, I strongly recommend 6-6.5 weeks of radiotherapy for this patient.  Recommend targeting the right tongue and bilateral neck. He understands that concurrent chemotherapy would be indicated if his margins are persistently positive.  He understands that he has a serious disease that has behaved aggressively and is life-threatening.  He understands that there is a risk of developing metastatic disease and locoregional recurrence.  He understands that the benefit of radiation is that it would substantially reduce  his risk of locoregional recurrence.  We discussed getting restaging scans months after completing treatment to rule out development of metastatic disease.  We discussed the potential risks, benefits, and side effects of radiotherapy. We talked in detail about acute and late effects. We discussed that some of the most bothersome acute effects may be mucositis, dysgeusia, salivary changes, skin irritation, hair loss, dehydration, weight loss and fatigue. We talked about late effects which include but are not necessarily limited to dysphagia, hypothyroidism, nerve injury, vascular injury, spinal cord injury, xerostomia, trismus, neck edema, dental/ mandibular injury, and potential injury to any of the tissues in the head and  neck region. No guarantees of treatment were given.    Patient declined signing a consent form at this time.  He would like to digest this information and decide whether he wishes to proceed with treatment.  He states that he will get back in touch with [place] and understands the importance of making a decision without excessive delay so that radiation can be delivered in a timely manner.  He also understands importance of a preradiation dental clearance to minimize risk of dental complications from treatment.  This will be a rate limiting step in starting his radiation so the sooner he sees a dentist the better  Simulation (treatment planning) will take place to  We also discussed that the treatment of head and neck cancer is a multidisciplinary process to maximize treatment outcomes and quality of life. For this reason the following referrals will be made if he decides to proceed with radiation:  Possible referral to medical oncology to discuss chemotherapy if his margins are persistently positive   Dentistry for dental evaluation, possible extractions in the radiation fields, and /or advice on reducing risk of cavities, osteoradionecrosis, or other oral issues.   Nutritionist for nutrition support during and after treatment.   Speech language pathology for swallowing and/or speech therapy.   Social work for social support.    Physical therapy due to risk of lymphedema in neck and deconditioning.   Baseline labs including TSH.  Anderson Malta, our patient navigator, was present for the consultation and will navigate accordingly based on the patient's decisions.  He has her contact information to get back in touch with Anderson Malta once he decides how he would like to proceed.  It was a pleasure to meet him today and I look forward to participating in his care if he decides to proceed.  On date of service, in total, I spent 60 minutes on this encounter. Patient was seen in  person.  __________________________________________   Eppie Gibson, MD  This document serves as a record of services personally performed by Eppie Gibson, MD. It was created on her behalf by Roney Mans, a trained medical scribe. The creation of this record is based on the scribe's personal observations and the provider's statements to them. This document has been checked and approved by the attending provider.

## 2020-08-16 ENCOUNTER — Ambulatory Visit
Admission: RE | Admit: 2020-08-16 | Discharge: 2020-08-16 | Disposition: A | Discharge status: Home / Self Care | Payer: 59 | Source: Ambulatory Visit | Attending: Radiation Oncology | Admitting: Radiation Oncology

## 2020-08-16 ENCOUNTER — Other Ambulatory Visit: Payer: Self-pay

## 2020-08-16 ENCOUNTER — Encounter: Payer: Self-pay | Admitting: Radiation Oncology

## 2020-08-16 VITALS — BP 148/91 | HR 84 | Temp 97.0°F | Resp 18 | Ht 71.0 in | Wt 156.0 lb

## 2020-08-16 DIAGNOSIS — Z794 Long term (current) use of insulin: Secondary | ICD-10-CM | POA: Insufficient documentation

## 2020-08-16 DIAGNOSIS — Z923 Personal history of irradiation: Secondary | ICD-10-CM | POA: Diagnosis not present

## 2020-08-16 DIAGNOSIS — E119 Type 2 diabetes mellitus without complications: Secondary | ICD-10-CM | POA: Diagnosis not present

## 2020-08-16 DIAGNOSIS — C021 Malignant neoplasm of border of tongue: Secondary | ICD-10-CM

## 2020-08-16 DIAGNOSIS — R918 Other nonspecific abnormal finding of lung field: Secondary | ICD-10-CM | POA: Diagnosis not present

## 2020-08-16 DIAGNOSIS — F1010 Alcohol abuse, uncomplicated: Secondary | ICD-10-CM | POA: Insufficient documentation

## 2020-08-16 NOTE — Progress Notes (Signed)
Oncology Nurse Navigator Documentation   Met with patient during initial consult with [name]. He was accompanied by his mother, Jana Half.  Further introduced myself as his  Navigator, explained my role as a member of the Care Team. Provided New Patient Information packet: Contact information for physician, this navigator, other members of the Care Team Advance Directive information ([place] blue pamphlet with LCSW insert); provided [facility] AD booklet at his request,  Fall Prevention Patient [place] Information sheet Symptom Management Clinic information [facility] map with highlight of [place] SLP Information sheet Assisted with post-consult appt scheduling.  They verbalized understanding of information provided. I encouraged him to call with questions/concerns moving forward.  Harlow Asa, RN, BSN, OCN Head & Neck Oncology Nurse [place] at [place] [phone]

## 2020-08-16 NOTE — Anesthesia Postprocedure Evaluation (Signed)
Anesthesia Post Note  Patient: Shawn Rhodes  Procedure(s) Performed: REVISION PARTIAL GLOSSECTOMY     Patient location during evaluation: PACU Anesthesia Type: General Level of consciousness: sedated and patient cooperative Pain management: pain level controlled Vital Signs Assessment: post-procedure vital signs reviewed and stable Respiratory status: spontaneous breathing Cardiovascular status: stable Anesthetic complications: no   No notable events documented.  Last Vitals:  Vitals:   08/12/20 1755 08/12/20 1757  BP: 130/72 127/75  Pulse: 95 95  Resp: (!) 26 17  Temp:    SpO2: 98% 99%    Last Pain:  Vitals:   08/12/20 1755  TempSrc:   PainSc: [place]

## 2020-08-17 ENCOUNTER — Encounter: Payer: Self-pay | Admitting: General Practice

## 2020-08-17 NOTE — Progress Notes (Signed)
[place] Psychosocial Distress Screening Clinical Social Work  Clinical Social Work was referred by distress screening protocol.  The patient scored a 5 on the Psychosocial Distress Thermometer which indicates moderate distress. Clinical Social Worker contacted patient by phone to assess for distress and other psychosocial needs.   ONCBCN DISTRESS SCREENING 08/16/2020  Screening Type Initial Screening  Distress experienced in past week (1-10) 5  Emotional problem type Adjusting to illness  Physician notified of physical symptoms Yes  Referral to clinical psychology No  Referral to clinical social work Yes  Referral to dietition Yes  Referral to financial advocate No  Referral to support programs Yes  Referral to palliative care No    Clinical Social Worker follow up needed: Yes.    If yes, follow up plan:  Unable to reach, call again.  Left VM w my contact information and encouragement to call me back.    Beverely Pace, [place], LCSW Clinical Social Worker Phone:  [phone]

## 2020-08-18 ENCOUNTER — Telehealth: Payer: Self-pay | Admitting: General Practice

## 2020-08-18 NOTE — Telephone Encounter (Signed)
[place] CSW Progress Notes  Second call to patient to address Distress Screen, left VM w my contact information and encouragement to return call.  Edwyna Shell, LCSW Clinical Social Worker Phone:  [phone]

## 2020-08-19 ENCOUNTER — Encounter: Payer: Self-pay | Admitting: General Practice

## 2020-08-19 ENCOUNTER — Encounter: Payer: Self-pay | Admitting: Radiation Oncology

## 2020-08-19 DIAGNOSIS — C021 Malignant neoplasm of border of tongue: Secondary | ICD-10-CM | POA: Insufficient documentation

## 2020-08-19 LAB — SURGICAL PATHOLOGY

## 2020-08-19 NOTE — Progress Notes (Signed)
[place] CSW Progress Notes  Third call to patient per referral from Dr Isidore Moos.  Unable to reach, left VM w my contact information and brief description of resources available through [place].  Closing referral, please reconsult if needed.  Edwyna Shell, LCSW Clinical Social Worker Phone:  [phone]

## 2020-08-19 NOTE — Progress Notes (Signed)
Dental Form with Estimates of Radiation Dose        Diagnosis:    ICD-10-CM   1. Squamous cell carcinoma of lateral tongue (HCC)  C02.1 Ambulatory referral to Social Work      Prognosis: curative  Anticipated # of fractions: 30-33    Daily?: yes  # of weeks of radiotherapy: 6-6.5  Chemotherapy?: ? TBD  Anticipated xerostomia:  Mild permanent   Pre-simulation needs:  scatter protection vs tongue positioner, depending on amount of metal in mouth.       Simulation: ASAP    Other Notes: patient is still decided if he will consent to RT.  50Gy could reach all right tooth roots, unless tongue positioner is used; if so, that would allow upper roots to be spared.   Anticipate sparing left roots. And it is possible the most anterior right roots, upper and lower, will be just under 50Gy.  Ask Anderson Malta re: dispo before you schedule appt.  Please contact Eppie Gibson, MD, with patient's disposition after evaluation and/or dental treatment.

## 2020-08-30 ENCOUNTER — Emergency Department (HOSPITAL_COMMUNITY)
Admission: EM | Admit: 2020-08-30 | Discharge: 2020-08-30 | Disposition: A | Discharge status: Home / Self Care | Payer: 59 | Attending: Physician Assistant | Admitting: Physician Assistant

## 2020-08-30 ENCOUNTER — Encounter (HOSPITAL_COMMUNITY): Payer: Self-pay | Admitting: Emergency Medicine

## 2020-08-30 ENCOUNTER — Other Ambulatory Visit: Payer: Self-pay

## 2020-08-30 DIAGNOSIS — R42 Dizziness and giddiness: Secondary | ICD-10-CM | POA: Diagnosis not present

## 2020-08-30 DIAGNOSIS — Z5321 Procedure and treatment not carried out due to patient leaving prior to being seen by health care provider: Secondary | ICD-10-CM | POA: Insufficient documentation

## 2020-08-30 DIAGNOSIS — Z85828 Personal history of other malignant neoplasm of skin: Secondary | ICD-10-CM | POA: Diagnosis not present

## 2020-08-30 LAB — COMPREHENSIVE METABOLIC PANEL
ALT: 69 U/L — ABNORMAL HIGH (ref 0–44)
AST: 38 U/L (ref 15–41)
Albumin: 4 g/dL (ref 3.5–5.0)
Alkaline Phosphatase: 72 U/L (ref 38–126)
Anion gap: 11 (ref 5–15)
BUN: 11 mg/dL (ref 6–20)
CO2: 23 mmol/L (ref 22–32)
Calcium: 8.8 mg/dL — ABNORMAL LOW (ref 8.9–10.3)
Chloride: 100 mmol/L (ref 98–111)
Creatinine, Ser: 0.94 mg/dL (ref 0.61–1.24)
GFR, Estimated: >60 mL/min (ref >60)
Glucose, Bld: 265 mg/dL — ABNORMAL HIGH (ref 70–99)
Potassium: 4.7 mmol/L (ref 3.5–5.1)
Sodium: 134 mmol/L — ABNORMAL LOW (ref 135–145)
Total Bilirubin: 1.2 mg/dL (ref 0.3–1.2)
Total Protein: 6.8 g/dL (ref 6.5–8.1)

## 2020-08-30 LAB — CBC WITH DIFFERENTIAL/PLATELET
Abs Immature Granulocytes: 0.02 10*3/uL (ref 0.00–0.07)
Basophils Absolute: 0 10*3/uL (ref 0.0–0.1)
Basophils Relative: 0 %
Eosinophils Absolute: 0 10*3/uL (ref 0.0–0.5)
Eosinophils Relative: 0 %
HCT: 39.4 % (ref 39.0–52.0)
Hemoglobin: 12.9 g/dL — ABNORMAL LOW (ref 13.0–17.0)
Immature Granulocytes: 0 %
Lymphocytes Relative: 13 %
Lymphs Abs: 1.1 10*3/uL (ref 0.7–4.0)
MCH: 29.5 pg (ref 26.0–34.0)
MCHC: 32.7 g/dL (ref 30.0–36.0)
MCV: 90 fL (ref 80.0–100.0)
Monocytes Absolute: 0.5 10*3/uL (ref 0.1–1.0)
Monocytes Relative: 6 %
Neutro Abs: 6.4 10*3/uL (ref 1.7–7.7)
Neutrophils Relative %: 81 %
Platelets: 345 10*3/uL (ref 150–400)
RBC: 4.38 MIL/uL (ref 4.22–5.81)
RDW: 12.4 % (ref 11.5–15.5)
WBC: 8 10*3/uL (ref 4.0–10.5)
nRBC: 0 % (ref 0.0–0.2)

## 2020-08-30 LAB — TROPONIN I (HIGH SENSITIVITY): Troponin I (High Sensitivity): 3 ng/L (ref <18)

## 2020-08-30 NOTE — ED Triage Notes (Signed)
Pt here from home with c/o feeling  jittery after taking his supplements that he takes every day , cbg 107 ,

## 2020-08-30 NOTE — ED Provider Notes (Signed)
Emergency Medicine Provider Triage Evaluation Note  Shawn Rhodes , a 45 y.o. male  was evaluated in triage.  Pt complains of feeling lightheaded, jittery, dry mouth, reports that he feels like it is getting better he states that at about 9 15-9 30 this morning he had sudden onset of these multiple symptoms.  He took his supplements this morning.  He has been taking these every day for multiple months.  They include rhodola root, vitamin D, vitamin B, and alpha lipoid acid.  He is currently recovering from treatment  Rhodola root, vitd, b, alpha lipoic acid, he has been taking them for multiple months.   He recently had a revision partial glossectomy for squamous cell carcinoma, and is being evaluated and considered for radiation treatment, however he is not currently undergoing chemo or radiation.  He denies any leg swelling or fevers.  No cough or hemoptysis.  When he woke up this morning he felt well. Review of Systems  Positive: Light headed,  Negative: syncope  Physical Exam  BP (!) 145/77 (BP Location: Right Arm)   Pulse 90   Temp 98.4 F (36.9 C)   Resp 16   SpO2 100%  Gen:   Awake, no distress   Resp:  Normal effort  MSK:   Moves extremities without difficulty  Other:  Normal finger-nose-finger bilaterally.  Speech is nonslurred.  Regular rate rhythm, lungs clear to auscultation bilaterally.  Medical Decision Making  Medically screening exam initiated at 12:29 PM.  Appropriate orders placed.  Shawn Rhodes was informed that the remainder of the evaluation will be completed by another provider, this initial triage assessment does not replace that evaluation, and the importance of remaining in the ED until their evaluation is complete.   Note: Portions of this report may have been transcribed using voice recognition software. Every effort was made to ensure accuracy; however, inadvertent computerized transcription errors may be present    Lorin Glass, PA-C 08/30/20  [place]    Malvin Johns, MD 08/30/20 1451

## 2020-08-30 NOTE — ED Notes (Addendum)
Pt decided to leave since results were back in my chart to follow up with primary care.

## 2020-09-21 NOTE — Progress Notes (Signed)
Oncology Nurse Navigator Documentation   Shawn Rhodes met with Shawn Rhodes on 08/16/20 after a referral from Shawn Rhodes for Squamous Cell Carcinoma of his lateral tongue. It was recommended at the time by Shawn Rhodes for him to receive radiation but Shawn Rhodes wanted to take some time to digest the information he received and possibly also go for a second opinion. I called him and left a voice mail yesterday asking for a return call to update Shawn Rhodes on his decision regarding future treatment for his cancer. I provided him with my direct contact information and requested a call back as soon as possible. Today I called Shawn Rhodes office and confirmed that he does have a follow up scheduled with him on 10/25/20. I will notify Shawn Rhodes of my attempt to call Shawn Rhodes and if/when he returns my call.   Harlow Asa RN, BSN, OCN Head & Neck Oncology Nurse [place] at [facility] Phone # [phone]  Fax # [phone]

## 2020-11-17 ENCOUNTER — Encounter (INDEPENDENT_AMBULATORY_CARE_PROVIDER_SITE_OTHER): Payer: 59 | Admitting: Ophthalmology

## 2020-11-23 ENCOUNTER — Encounter (INDEPENDENT_AMBULATORY_CARE_PROVIDER_SITE_OTHER): Payer: 59 | Admitting: Ophthalmology

## 2020-12-12 ENCOUNTER — Encounter (INDEPENDENT_AMBULATORY_CARE_PROVIDER_SITE_OTHER): Payer: 59 | Admitting: Ophthalmology

## 2020-12-20 ENCOUNTER — Other Ambulatory Visit: Payer: Self-pay

## 2020-12-20 ENCOUNTER — Encounter (INDEPENDENT_AMBULATORY_CARE_PROVIDER_SITE_OTHER): Payer: 59 | Admitting: Ophthalmology

## 2020-12-20 DIAGNOSIS — H2513 Age-related nuclear cataract, bilateral: Secondary | ICD-10-CM

## 2020-12-20 DIAGNOSIS — H43813 Vitreous degeneration, bilateral: Secondary | ICD-10-CM

## 2020-12-20 DIAGNOSIS — E103593 Type 1 diabetes mellitus with proliferative diabetic retinopathy without macular edema, bilateral: Secondary | ICD-10-CM | POA: Diagnosis not present

## 2021-01-25 DIAGNOSIS — C029 Malignant neoplasm of tongue, unspecified: Secondary | ICD-10-CM | POA: Diagnosis not present

## 2021-02-01 DIAGNOSIS — C021 Malignant neoplasm of border of tongue: Secondary | ICD-10-CM | POA: Diagnosis not present

## 2021-02-01 DIAGNOSIS — C029 Malignant neoplasm of tongue, unspecified: Secondary | ICD-10-CM | POA: Diagnosis not present

## 2021-02-09 DIAGNOSIS — N509 Disorder of male genital organs, unspecified: Secondary | ICD-10-CM | POA: Diagnosis not present

## 2021-02-09 DIAGNOSIS — C029 Malignant neoplasm of tongue, unspecified: Secondary | ICD-10-CM | POA: Diagnosis not present

## 2021-02-10 DIAGNOSIS — N5089 Other specified disorders of the male genital organs: Secondary | ICD-10-CM | POA: Diagnosis not present

## 2021-02-10 DIAGNOSIS — C029 Malignant neoplasm of tongue, unspecified: Secondary | ICD-10-CM | POA: Diagnosis not present

## 2021-02-13 DIAGNOSIS — C028 Malignant neoplasm of overlapping sites of tongue: Secondary | ICD-10-CM | POA: Diagnosis not present

## 2021-02-16 DIAGNOSIS — C029 Malignant neoplasm of tongue, unspecified: Secondary | ICD-10-CM | POA: Diagnosis not present

## 2021-02-17 DIAGNOSIS — C029 Malignant neoplasm of tongue, unspecified: Secondary | ICD-10-CM | POA: Diagnosis not present

## 2021-03-08 ENCOUNTER — Other Ambulatory Visit: Payer: Self-pay

## 2021-03-08 ENCOUNTER — Emergency Department (HOSPITAL_COMMUNITY)
Admission: EM | Admit: 2021-03-08 | Discharge: 2021-03-08 | Disposition: A | Discharge status: Home / Self Care | Payer: 59 | Attending: Emergency Medicine | Admitting: Emergency Medicine

## 2021-03-08 DIAGNOSIS — R638 Other symptoms and signs concerning food and fluid intake: Secondary | ICD-10-CM | POA: Diagnosis not present

## 2021-03-08 DIAGNOSIS — Z85810 Personal history of malignant neoplasm of tongue: Secondary | ICD-10-CM | POA: Insufficient documentation

## 2021-03-08 DIAGNOSIS — Z794 Long term (current) use of insulin: Secondary | ICD-10-CM | POA: Diagnosis not present

## 2021-03-08 LAB — BASIC METABOLIC PANEL
Anion gap: 9 (ref 5–15)
BUN: 15 mg/dL (ref 6–20)
CO2: 27 mmol/L (ref 22–32)
Calcium: 9.1 mg/dL (ref 8.9–10.3)
Chloride: 103 mmol/L (ref 98–111)
Creatinine, Ser: 0.86 mg/dL (ref 0.61–1.24)
GFR, Estimated: >60 mL/min (ref >60)
Glucose, Bld: 74 mg/dL (ref 70–99)
Potassium: 4.3 mmol/L (ref 3.5–5.1)
Sodium: 139 mmol/L (ref 135–145)

## 2021-03-08 LAB — CBC WITH DIFFERENTIAL/PLATELET
Abs Immature Granulocytes: 0.03 10*3/uL (ref 0.00–0.07)
Basophils Absolute: 0 10*3/uL (ref 0.0–0.1)
Basophils Relative: 0 %
Eosinophils Absolute: 0 10*3/uL (ref 0.0–0.5)
Eosinophils Relative: 0 %
HCT: 40.8 % (ref 39.0–52.0)
Hemoglobin: 14.1 g/dL (ref 13.0–17.0)
Immature Granulocytes: 0 %
Lymphocytes Relative: 12 %
Lymphs Abs: 1 10*3/uL (ref 0.7–4.0)
MCH: 29.9 pg (ref 26.0–34.0)
MCHC: 34.6 g/dL (ref 30.0–36.0)
MCV: 86.4 fL (ref 80.0–100.0)
Monocytes Absolute: 0.6 10*3/uL (ref 0.1–1.0)
Monocytes Relative: 7 %
Neutro Abs: 6.8 10*3/uL (ref 1.7–7.7)
Neutrophils Relative %: 81 %
Platelets: 349 10*3/uL (ref 150–400)
RBC: 4.72 MIL/uL (ref 4.22–5.81)
RDW: 12.1 % (ref 11.5–15.5)
WBC: 8.4 10*3/uL (ref 4.0–10.5)
nRBC: 0 % (ref 0.0–0.2)

## 2021-03-08 MED ORDER — SODIUM CHLORIDE 0.9 % IV BOLUS
1000.0000 mL | Freq: Once | INTRAVENOUS | Status: AC
  Administered 2021-03-08: 1000 mL via INTRAVENOUS

## 2021-03-08 NOTE — ED Provider Triage Note (Signed)
Emergency Medicine Provider Triage Evaluation Note ? ?Shawn Rhodes , a 46 y.o. male  was evaluated in triage.  Pt complains of dehydration.  Patient had squamous cell carcinoma area removed from tongue in August.  Patient states since then he has been unable to tolerate oral intake.  Patient seen by PCP for this issue, sent to ED for fluid hydration as well as possible GI consult for feeding tube placement per mother.  (?) ? ?Review of Systems  ?Positive: Inability to eat/drink ?Negative: Chest pain, shortness of breath, nausea, vomiting, fevers, diarrhea ? ?Physical Exam  ?BP 130/86 (BP Location: Right Arm)   Pulse 79   Temp 99.2 ?F (37.3 ?C)   Resp 14   SpO2 98%  ?Gen:   Awake, no distress   ?Resp:  Normal effort  ?MSK:   Moves extremities without difficulty  ?Other:   ? ?Medical Decision Making  ?Medically screening exam initiated at 3:31 PM.  Appropriate orders placed.  Shawn Rhodes was informed that the remainder of the evaluation will be completed by another provider, this initial triage assessment does not replace that evaluation, and the importance of remaining in the ED until their evaluation is complete. ? ? ?  ?Azucena Cecil, PA-C ?03/08/21 1532 ? ?

## 2021-03-08 NOTE — ED Provider Notes (Signed)
?[facility] ?Provider Note ? ? ?CSN: [id number] ?Arrival date & time: 03/08/21  1429 ? ?  ? ?History ? ?No chief complaint on file. ? ? ?Ha Placeres is a 46 y.o. male. ? ?46 yo M with a chief complaints of concern for dehydration.  Mother states that he really has not been eating and drinking well over the past week or so.  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  He has been seen by oncology and ENT at [facility] and offered different options which he has declined.  Is currently seeing a different doctor that he did not disclose who told him that he needed some tongue rest.  He had called his family doctor today with concern for decreased oral intake and he had been sent over for possible [name]. ? ? ? ?  ? ?Home Medications ?Prior to Admission medications   ?Medication Sig Start Date End Date Taking? Authorizing Provider  ?insulin aspart (NOVOLOG FLEXPEN) 100 UNIT/ML FlexPen Inject 1-15 Units into the skin 3 (three) times daily with meals.    [provider]  ?insulin glargine (LANTUS SOLOSTAR) 100 UNIT/ML Solostar Pen Inject 8-10 Units into the skin 2 (two) times daily.    [provider]  ?lisinopril (ZESTRIL) 5 MG tablet Take 5 mg by mouth daily.    [provider]  ?   ? ?Allergies    ?Patient has no known allergies.   ? ?Review of Systems   ?Review of Systems ? ?Physical Exam ?Updated Vital Signs ?BP 125/81   Pulse 66   Temp 98.9 ?F (37.2 ?C) (Oral)   Resp 18   SpO2 98%  ?Physical Exam ?Vitals and nursing note reviewed.  ?Constitutional:   ?   Appearance: He is well-developed.  ?HENT:  ?   Head: Normocephalic and atraumatic.  ?   Mouth/Throat:  ?   Comments: Slight tongue elevation tolerating secretions without difficulty ?Eyes:  ?   Pupils: Pupils are equal, round, and reactive to light.  ?Neck:  ?   Vascular: No JVD.  ?Cardiovascular:  ?   Rate and Rhythm: Normal rate and  regular rhythm.  ?   Heart sounds: No murmur heard. ?  No friction rub. No gallop.  ?Pulmonary:  ?   Effort: No respiratory distress.  ?   Breath sounds: No wheezing.  ?Abdominal:  ?   General: There is no distension.  ?   Tenderness: There is no abdominal tenderness. There is no guarding or rebound.  ?Musculoskeletal:     ?   General: Normal range of motion.  ?   Cervical back: Normal range of motion and neck supple.  ?Skin: ?   Coloration: Skin is not pale.  ?   Findings: No rash.  ?Neurological:  ?   Mental Status: He is alert and oriented to person, place, and time.  ?Psychiatric:     ?   Behavior: Behavior normal.  ? ? ?ED Results / Procedures / Treatments   ?Labs ?(all labs ordered are listed, but only abnormal results are displayed) ?Labs Reviewed  ?CBC WITH DIFFERENTIAL/PLATELET  ?BASIC METABOLIC PANEL  ? ? ?EKG ?None ? ?Radiology ?No results found. ? ?Procedures ?Procedures  ? ? ?Medications Ordered in ED ?Medications  ?sodium chloride 0.9 % bolus 1,000 mL (0 mLs Intravenous Stopped 03/08/21 1825)  ? ? ?ED Course/ Medical Decision Making/ A&P ?  ?                        ?  Medical Decision Making ? ?46 yo M with a chief complaint of dehydration.  Has a significant past medical history of tongue cancer.  Is currently weighing his options on therapy.  Sent by PCP for evaluation for possible [name].   ? ?The patient's labs have resulted and are unremarkable.  No leukocytosis no acute anemia no significant electrolyte abnormalities renal function is actually better than baseline. ? ?Patient received a liter of IV fluids without issue.  Will discharge home.  Encouraged to follow-up with his doctor primarily handling his cancer and also given information for GI to evaluate for possible [name]. ? ?11:56 PM:  I have discussed the diagnosis/risks/treatment options with the patient.  Evaluation and diagnostic testing in the emergency department does not suggest an emergent condition requiring  admission or immediate intervention beyond what has been performed at this time.  They will follow up with  PCP. We also discussed returning to the ED immediately if new or worsening sx occur. We discussed the sx which are most concerning (e.g., sudden worsening pain, fever, inability to tolerate by mouth) that necessitate immediate return. Medications administered to the patient during their visit and any new prescriptions provided to the patient are listed below. ? ?Medications given during this visit ?Medications  ?sodium chloride 0.9 % bolus 1,000 mL (0 mLs Intravenous Stopped 03/08/21 1825)  ? ? ? ?The patient appears reasonably screen and/or stabilized for discharge and I doubt any other medical condition or other [facility] requiring further screening, evaluation, or treatment in the ED at this time prior to discharge.  ? ? ? ? ? ? ? ? ?Final Clinical Impression(s) / ED Diagnoses ?Final diagnoses:  ?Decreased oral intake  ? ? ?Rx / DC Orders ?ED Discharge Orders   ? ? None  ? ?  ? ? ?  ?Deno Etienne, DO ?03/08/21 2356 ? ?

## 2021-03-08 NOTE — Discharge Instructions (Signed)
Typically when you have cancer and your oncologist feels like he needed a G-tube then they ended up scheduling it for you through a certain pathway.  Is not typical to have this performed to the emergency department.  The gastroenterology group seem to tend to help out with this.  I have given you information for the [facility] gastroenterologist that is on-call today.  Please give them a call tomorrow and see if they can see you in the office.  Certainly talk this over with your family doctor as well as your doctor that is helping manage your cancer.  Please return for worsening symptoms. ?

## 2021-03-08 NOTE — ED Notes (Signed)
IV stick attempted x2 without success, another RN will attempt. ?

## 2021-03-08 NOTE — ED Triage Notes (Signed)
Pt had tongue surgery last August, which has therefore affected his ability to eat and drink. Pt's mother has brought him to ED today to evaluate for dehydration. Pt has lost a significant amount of weight.  ?

## 2021-03-14 ENCOUNTER — Other Ambulatory Visit: Payer: Self-pay | Admitting: Gastroenterology

## 2021-03-14 DIAGNOSIS — E109 Type 1 diabetes mellitus without complications: Secondary | ICD-10-CM | POA: Diagnosis not present

## 2021-03-14 DIAGNOSIS — C029 Malignant neoplasm of tongue, unspecified: Secondary | ICD-10-CM

## 2021-03-14 DIAGNOSIS — R634 Abnormal weight loss: Secondary | ICD-10-CM

## 2021-03-15 ENCOUNTER — Other Ambulatory Visit (HOSPITAL_COMMUNITY): Payer: Self-pay | Admitting: Gastroenterology

## 2021-03-15 ENCOUNTER — Ambulatory Visit (HOSPITAL_COMMUNITY)
Admission: RE | Admit: 2021-03-15 | Discharge: 2021-03-15 | Disposition: A | Discharge status: Home / Self Care | Payer: 59 | Source: Ambulatory Visit | Attending: Gastroenterology | Admitting: Gastroenterology

## 2021-03-15 ENCOUNTER — Other Ambulatory Visit: Payer: Self-pay

## 2021-03-15 ENCOUNTER — Telehealth (HOSPITAL_COMMUNITY): Payer: Self-pay

## 2021-03-15 DIAGNOSIS — C029 Malignant neoplasm of tongue, unspecified: Secondary | ICD-10-CM

## 2021-03-15 DIAGNOSIS — R634 Abnormal weight loss: Secondary | ICD-10-CM

## 2021-03-15 NOTE — Telephone Encounter (Signed)
-----   Message from Sandi Mariscal, MD sent at 03/15/2021  1:24 PM EST ----- ?Regarding: RE: Peg placement ?OK for [name]. ? ?Please have the pt drink barium the night before the procedure. ? ?Anatomy amendable per PET CT from 7/15. ? ?Thx, ?Ulice Dash ? ?----- Message ----- ?From: Gildardo Pounds D ?Sent: 03/15/2021  11:28 AM EST ?To: Ir Procedure Requests ?Subject: Peg placement                                 ? ?Procedure: Peg placement ? ?Ordering: Dr. Clarene Essex [phone] ? ?Imaging: NM Pet done 07/22/20 in epic ? ?Dx: abnormal weight loss, squamous cell cancer of tongue ? ?Please review.  ? ?Thanks,  ?Ash ? ? ?

## 2021-03-16 ENCOUNTER — Telehealth (HOSPITAL_COMMUNITY): Payer: Self-pay

## 2021-03-16 NOTE — Telephone Encounter (Signed)
-----   Message from Markus Daft, MD sent at 03/16/2021  5:54 PM EST ----- ?Regarding: RE: Peg placement ?It's fine, we don't need it based on his anatomy. ? ?Thanks, ?Adam  ?----- Message ----- ?From: Gildardo Pounds D ?Sent: 03/16/2021   4:20 PM EST ?To: Markus Daft, MD ?Subject: FW: Peg placement                             ? ?Dr. Anselm Pancoast,  ? ?[facility] are [facility] at [facility] tomorrow. I was just now able to get the patient scheduled because we were waiting to get nutrition and education set up. He is not able to drink the barium at all because he can't keep anything down. Please advise.  ? ?Thanks,  ?Ash ?----- Message ----- ?From: Sandi Mariscal, MD ?Sent: 03/15/2021   1:25 PM EST ?To: Danielle Dess ?Subject: RE: Peg placement                             ? ?OK for [name]. ? ?Please have the pt drink barium the night before the procedure. ? ?Anatomy amendable per PET CT from 7/15. ? ?Thx, ?Ulice Dash ? ?----- Message ----- ?From: Gildardo Pounds D ?Sent: 03/15/2021  11:28 AM EST ?To: Ir Procedure Requests ?Subject: Peg placement                                 ? ?Procedure: Peg placement ? ?Ordering: Dr. Clarene Essex [phone] ? ?Imaging: NM Pet done 07/22/20 in epic ? ?Dx: abnormal weight loss, squamous cell cancer of tongue ? ?Please review.  ? ?Thanks,  ?Ash ? ? ? ? ?

## 2021-03-17 ENCOUNTER — Other Ambulatory Visit: Payer: Self-pay

## 2021-03-17 ENCOUNTER — Inpatient Hospital Stay (HOSPITAL_COMMUNITY)
Admit: 2021-03-17 | Discharge: 2021-03-20 | DRG: 148 | Disposition: A | Discharge status: Home / Self Care | Payer: 59 | Attending: Internal Medicine | Admitting: Internal Medicine

## 2021-03-17 ENCOUNTER — Other Ambulatory Visit (HOSPITAL_COMMUNITY): Payer: Self-pay | Admitting: Gastroenterology

## 2021-03-17 ENCOUNTER — Other Ambulatory Visit (HOSPITAL_COMMUNITY): Payer: Self-pay | Admitting: Physician Assistant

## 2021-03-17 ENCOUNTER — Ambulatory Visit (HOSPITAL_COMMUNITY)
Admission: RE | Admit: 2021-03-17 | Discharge: 2021-03-17 | Disposition: A | Discharge status: Home / Self Care | Payer: 59 | Source: Ambulatory Visit | Attending: Gastroenterology | Admitting: Gastroenterology

## 2021-03-17 ENCOUNTER — Other Ambulatory Visit: Payer: Self-pay | Admitting: Physician Assistant

## 2021-03-17 ENCOUNTER — Ambulatory Visit (HOSPITAL_COMMUNITY)
Admission: RE | Admit: 2021-03-17 | Discharge: 2021-03-17 | Disposition: A | Discharge status: Home / Self Care | Payer: 59 | Source: Ambulatory Visit | Attending: Physician Assistant | Admitting: Physician Assistant

## 2021-03-17 DIAGNOSIS — R634 Abnormal weight loss: Secondary | ICD-10-CM

## 2021-03-17 DIAGNOSIS — C029 Malignant neoplasm of tongue, unspecified: Secondary | ICD-10-CM

## 2021-03-17 DIAGNOSIS — Z79899 Other long term (current) drug therapy: Secondary | ICD-10-CM

## 2021-03-17 DIAGNOSIS — E119 Type 2 diabetes mellitus without complications: Secondary | ICD-10-CM | POA: Diagnosis present

## 2021-03-17 DIAGNOSIS — R627 Adult failure to thrive: Secondary | ICD-10-CM | POA: Diagnosis present

## 2021-03-17 DIAGNOSIS — C021 Malignant neoplasm of border of tongue: Principal | ICD-10-CM

## 2021-03-17 DIAGNOSIS — R1310 Dysphagia, unspecified: Secondary | ICD-10-CM | POA: Diagnosis not present

## 2021-03-17 DIAGNOSIS — Z20822 Contact with and (suspected) exposure to covid-19: Secondary | ICD-10-CM | POA: Diagnosis present

## 2021-03-17 DIAGNOSIS — R1311 Dysphagia, oral phase: Secondary | ICD-10-CM | POA: Diagnosis present

## 2021-03-17 DIAGNOSIS — E860 Dehydration: Secondary | ICD-10-CM | POA: Diagnosis present

## 2021-03-17 DIAGNOSIS — Z794 Long term (current) use of insulin: Secondary | ICD-10-CM

## 2021-03-17 HISTORY — PX: IR GASTROSTOMY TUBE MOD SED: IMG625

## 2021-03-17 HISTORY — PX: IR CT SPINE LTD: IMG2387

## 2021-03-17 HISTORY — PX: IR NASO G TUBE PLC W/FL W/RAD: IMG2321

## 2021-03-17 LAB — CBC WITH DIFFERENTIAL/PLATELET
Abs Immature Granulocytes: 0.02 10*3/uL (ref 0.00–0.07)
Basophils Absolute: 0 10*3/uL (ref 0.0–0.1)
Basophils Relative: 0 %
Eosinophils Absolute: 0 10*3/uL (ref 0.0–0.5)
Eosinophils Relative: 0 %
HCT: 40.3 % (ref 39.0–52.0)
Hemoglobin: 13.7 g/dL (ref 13.0–17.0)
Immature Granulocytes: 0 %
Lymphocytes Relative: 13 %
Lymphs Abs: 1.1 10*3/uL (ref 0.7–4.0)
MCH: 29.4 pg (ref 26.0–34.0)
MCHC: 34 g/dL (ref 30.0–36.0)
MCV: 86.5 fL (ref 80.0–100.0)
Monocytes Absolute: 0.6 10*3/uL (ref 0.1–1.0)
Monocytes Relative: 7 %
Neutro Abs: 6.5 10*3/uL (ref 1.7–7.7)
Neutrophils Relative %: 80 %
Platelets: 296 10*3/uL (ref 150–400)
RBC: 4.66 MIL/uL (ref 4.22–5.81)
RDW: 12.6 % (ref 11.5–15.5)
WBC: 8.3 10*3/uL (ref 4.0–10.5)
nRBC: 0 % (ref 0.0–0.2)

## 2021-03-17 LAB — GLUCOSE, CAPILLARY: Glucose-Capillary: 109 mg/dL — ABNORMAL HIGH (ref 70–99)

## 2021-03-17 LAB — PROTIME-INR
INR: 1.1 (ref 0.8–1.2)
Prothrombin Time: 14.3 seconds (ref 11.4–15.2)

## 2021-03-17 MED ORDER — GABAPENTIN 250 MG/5ML PO SOLN
300.0000 mg | Freq: Four times a day (QID) | ORAL | Status: DC
Start: 2021-03-17 — End: 2021-03-18
  Filled 2021-03-17 (×5): qty 6

## 2021-03-17 MED ORDER — LIDOCAINE HCL 1 % IJ SOLN
INTRAMUSCULAR | Status: AC
  Filled 2021-03-17: qty 20

## 2021-03-17 MED ORDER — GLUCAGON HCL RDNA (DIAGNOSTIC) 1 MG IJ SOLR
INTRAMUSCULAR | Status: AC
  Filled 2021-03-17: qty 1

## 2021-03-17 MED ORDER — GLUCERNA 1.2 CAL PO LIQD
1000.0000 mL | ORAL | Status: DC
  Administered 2021-03-17: 1000 mL
  Filled 2021-03-17 (×2): qty 1000

## 2021-03-17 MED ORDER — MIDAZOLAM HCL 2 MG/2ML IJ SOLN
INTRAMUSCULAR | Status: AC | PRN
  Administered 2021-03-17 (×6): .5 mg via INTRAVENOUS

## 2021-03-17 MED ORDER — GLUCAGON HCL RDNA (DIAGNOSTIC) 1 MG IJ SOLR
INTRAMUSCULAR | Status: AC | PRN
  Administered 2021-03-17: .5 mg via INTRAVENOUS

## 2021-03-17 MED ORDER — IBUPROFEN 100 MG/5ML PO SUSP
600.0000 mg | Freq: Four times a day (QID) | ORAL | Status: DC | PRN
  Administered 2021-03-17 – 2021-03-20 (×6): 600 mg via ORAL
  Filled 2021-03-17 (×10): qty 30

## 2021-03-17 MED ORDER — SODIUM CHLORIDE 0.9 % IV SOLN: INTRAVENOUS | Status: AC

## 2021-03-17 MED ORDER — MIDAZOLAM HCL 2 MG/2ML IJ SOLN
INTRAMUSCULAR | Status: AC
  Filled 2021-03-17: qty 2

## 2021-03-17 MED ORDER — IOHEXOL 300 MG/ML  SOLN: 100.0000 mL | Freq: Once | INTRAMUSCULAR | Status: DC | PRN

## 2021-03-17 MED ORDER — FENTANYL CITRATE (PF) 100 MCG/2ML IJ SOLN
INTRAMUSCULAR | Status: AC
  Filled 2021-03-17: qty 2

## 2021-03-17 MED ORDER — LIDOCAINE VISCOUS HCL 2 % MT SOLN
OROMUCOSAL | Status: AC
  Filled 2021-03-17: qty 15

## 2021-03-17 MED ORDER — GLUCERNA 1.2 CAL PO LIQD
1000.0000 mL | ORAL | Status: DC
  Filled 2021-03-17: qty 1000

## 2021-03-17 MED ORDER — INSULIN ASPART 100 UNIT/ML IJ SOLN
0.0000 [IU] | Freq: Three times a day (TID) | INTRAMUSCULAR | Status: DC
  Administered 2021-03-18: 6 [IU] via SUBCUTANEOUS

## 2021-03-17 MED ORDER — SODIUM CHLORIDE 0.9 % IV SOLN: INTRAVENOUS | Status: DC

## 2021-03-17 MED ORDER — CEFAZOLIN SODIUM-DEXTROSE 2-4 GM/100ML-% IV SOLN: 2.0000 g | INTRAVENOUS | Status: AC

## 2021-03-17 MED ORDER — MORPHINE SULFATE 10 MG/5ML PO SOLN: 5.0000 mg | ORAL | Status: DC | PRN

## 2021-03-17 MED ORDER — IOHEXOL 300 MG/ML  SOLN
100.0000 mL | Freq: Once | INTRAMUSCULAR | Status: AC | PRN
  Administered 2021-03-17: 20 mL

## 2021-03-17 MED ORDER — FENTANYL CITRATE (PF) 100 MCG/2ML IJ SOLN
INTRAMUSCULAR | Status: AC | PRN
  Administered 2021-03-17 (×4): 25 ug via INTRAVENOUS

## 2021-03-17 MED ORDER — CEFAZOLIN SODIUM-DEXTROSE 2-4 GM/100ML-% IV SOLN
INTRAVENOUS | Status: AC
  Administered 2021-03-17: 2000 mg
  Filled 2021-03-17: qty 100

## 2021-03-17 MED ORDER — OXYCODONE HCL 5 MG/5ML PO SOLN
5.0000 mg | ORAL | Status: DC | PRN
  Administered 2021-03-17 – 2021-03-20 (×13): 5 mg via ORAL
  Filled 2021-03-17 (×13): qty 5

## 2021-03-17 NOTE — Progress Notes (Signed)
Pt resting at this time.

## 2021-03-17 NOTE — Sedation Documentation (Signed)
To IR for NG insertion per Dr Anselm Pancoast ? ?

## 2021-03-17 NOTE — Progress Notes (Addendum)
?  IR protocol = Hydrocodone is typically prescribed after gastrostomy tube placement. ? ?Review of the [place] pmpaware.net site showed overdose risk score of 460 and there was a recent prescription for Oxycodone '5mg'$  #10. (See below) ? ?Will hold off on narcotic prescription at this time. ? ? ? ? ? ? ?Murrell Redden PA-C ?03/17/2021 ?11:51 AM ? ? ? ? ?

## 2021-03-17 NOTE — Sedation Documentation (Addendum)
Dr Anselm Pancoast in to talk to patient and his mother re plan for admission by Hospitalist, for NG tube and nutrition. Patient in agreement for NG tube. Report given to Jessica Priest RN. ?

## 2021-03-17 NOTE — H&P (Signed)
? ?Chief Complaint: ?Patient was seen in consultation today for recurrent SCC of the floor of mouth at the request of Magod,Marc ? ?Referring Physician(s): ?Magod,Marc ? ?Supervising Physician: Markus Daft ? ?Patient Status: [facility] - Out-pt ? ?History of Present Illness: ?Shawn Rhodes is a 46 y.o. male with recurrent SCC of floor of mouth. He has been unable to eat and drink well for the past couple of weeks.  He reports being told needing "tongue rest".  He presented to ED on 3/1 with mom, concerned for dehydration.  He presents to IR today for [name]. ? ?Unable to drink barium as he has not tolerated PO intake.  Does not speak.  Mom accompanies him.  Reports cough and post nasal drip.  No other complaints. ? ? ? ?Past Medical History:  ?Diagnosis Date  ? Cancer [facility])   ? Diabetes mellitus without complication ([place])   ? type I  ? ? ?Past Surgical History:  ?Procedure Laterality Date  ? PARTIAL GLOSSECTOMY  07/30/2020  ? with right neck dissection  ? WISDOM TOOTH EXTRACTION    ? ? ?Allergies: ?Patient has no known allergies. ? ?Medications: ?Prior to Admission medications   ?Medication Sig Start Date End Date Taking? Authorizing Provider  ?insulin aspart (NOVOLOG FLEXPEN) 100 UNIT/ML FlexPen Inject 1-15 Units into the skin 3 (three) times daily with meals.   Yes [provider]  ?insulin glargine (LANTUS SOLOSTAR) 100 UNIT/ML Solostar Pen Inject 8-10 Units into the skin 2 (two) times daily.   Yes [provider]  ?lisinopril (ZESTRIL) 5 MG tablet Take 5 mg by mouth daily.    [provider]  ?  ? ?No family history on file. ? ?Social History  ? ?Socioeconomic History  ? Marital status: Single  ?  Spouse name: Not on file  ? Number of children: Not on file  ? Years of education: Not on file  ? Highest education level: Not on file  ?Occupational History  ? Not on file  ?Tobacco Use  ? Smoking status: Never  ? Smokeless tobacco: Never  ?Vaping Use  ? Vaping Use: Never used   ?Substance and Sexual Activity  ? Alcohol use: Yes  ?  Comment: socially  ? Drug use: Never  ? Sexual activity: Not on file  ?Other Topics Concern  ? Not on file  ?Social History Narrative  ? Not on file  ? ?Social Determinants of Health  ? ?Financial Resource Strain: Not on file  ?Food Insecurity: Not on file  ?Transportation Needs: Not on file  ?Physical Activity: Not on file  ?Stress: Not on file  ?Social Connections: Not on file  ? ? ?Review of Systems: A 12 point ROS discussed and pertinent positives are indicated in the HPI above.  All other systems are negative. ? ? ?Vital Signs: ?BP (!) 98/59   Pulse 69   Temp 98.6 ?F (37 ?C) (Oral)   Resp 16   Ht '5\' 10"'$  (1.778 m)   Wt 145 lb (65.8 kg)   SpO2 96%   BMI 20.81 kg/m?  ? ?Physical Exam ?Constitutional:   ?   Appearance: Normal appearance.  ?HENT:  ?   Head: Normocephalic and atraumatic.  ?   Mouth/Throat:  ?   Mouth: Mucous membranes are moist.  ?   Pharynx: Oropharynx is clear.  ?Eyes:  ?   Extraocular Movements: Extraocular movements intact.  ?Cardiovascular:  ?   Rate and Rhythm: Normal rate and regular rhythm.  ?Pulmonary:  ?  Effort: Pulmonary effort is normal.  ?   Breath sounds: Normal breath sounds.  ?Abdominal:  ?   General: Abdomen is flat.  ?   Palpations: Abdomen is soft.  ?Skin: ?   General: Skin is warm and dry.  ?   Capillary Refill: Capillary refill takes less than 2 seconds.  ?Neurological:  ?   General: No focal deficit present.  ?   Mental Status: He is alert and oriented to person, place, and time.  ?Psychiatric:     ?   Mood and Affect: Mood normal.     ?   Behavior: Behavior normal.  ? ? ?Imaging: ?No results found. ? ?Labs: ? ?CBC: ?Recent Labs  ?  07/28/20 ?1034 08/30/20 ?1235 03/08/21 ?1533 03/17/21 ?0945  ?WBC 6.5 8.0 8.4 8.3  ?HGB 13.4 12.9* 14.1 13.7  ?HCT 38.7* 39.4 40.8 40.3  ?PLT 365 345 349 296  ? ? ?BMP: ?Recent Labs  ?  07/28/20 ?1034 08/30/20 ?1235 03/08/21 ?1533  ?NA 137 134* 139  ?K 4.2 4.7 4.3  ?CL 104 100 103  ?CO2  '26 23 27  '$ ?GLUCOSE 256* 265* 74  ?BUN '9 11 15  '$ ?CALCIUM 9.1 8.8* 9.1  ?CREATININE 0.91 0.94 0.86  ?GFRNONAA >60 >60 >60  ? ? ?LIVER FUNCTION TESTS: ?Recent Labs  ?  08/30/20 ?1235  ?BILITOT 1.2  ?AST 38  ?ALT 69*  ?ALKPHOS 72  ?PROT 6.8  ?ALBUMIN 4.0  ? ? ?Assessment and Plan: ? ?SCC of tongue ?--reviewed labs and history ?--for [name] today ?--educator coming after for instruction on use ?--plan for d/c home later today. ? ?Risks and benefits image guided gastrostomy tube placement was discussed with the patient including, but not limited to the need for a barium enema during the procedure, bleeding, infection, peritonitis and/or damage to adjacent structures. ? ?All of the patient's questions were answered, patient is agreeable to proceed. ? ?Consent signed and in chart.  ? ?Thank you for this interesting consult.  I greatly enjoyed meeting Shawn Rhodes and look forward to participating in their care.  A copy of this report was sent to the requesting provider on this date. ? ?Electronically Signed: ?Pasty Spillers, PA ?03/17/2021, 9:57 AM ? ? ?I spent a total of 30 Minutes in face to face in clinical consultation, greater than 50% of which was counseling/coordinating care for g-tube placement. ? ?

## 2021-03-17 NOTE — Progress Notes (Signed)
?  Dr. Anselm Pancoast unable to place percutaneous gastrostomy tube today due to patients anatomy and stomach behind the sternum. ? ?His mother is quite concerned because he has had nothing by mouth for 2 weeks. She is worried he is dehydrated and is already visibly malnourished. ? ?Dr. Anselm Pancoast reached out to [facility] and they will admit for hydration and initiation of tube feeds. ? ?Dr. Anselm Pancoast will place a Cortrak in IR. ? ?He will need a General Surgery consult for open gastrostomy tube placement. ? ?Murrell Redden PA-C ?03/17/2021 ?1:43 PM ? ? ? ? ?

## 2021-03-17 NOTE — Sedation Documentation (Signed)
Dr Anselm Pancoast in and spoke to patient and mom re being unable to get gastrostomy tube in. Dr Anselm Pancoast will check on getting patient admitted and have NG tube insertion and tube feeds for nutrition. Patient moved to Nurses station and Short Stay aware patient will not be coming back to them. ?

## 2021-03-17 NOTE — Procedures (Signed)
Interventional Radiology Procedure: ? ? ?Indications: Squamous cell carcinoma of the tongue.  Malnutrition.  Scheduled for percutaneous gastrostomy tube placement. ? ?Procedure: 1) Attempted [name] 2) Placement of nasogastric Cortrak tube ? ?Findings: Percutaneous gastrostomy tube attempted but aborted because could not find a safe percutaneous window for tube placement.  Discussed with Hospitalist service about admission and placement of nasogastric tube.  Cortrak placed with fluoroscopy, tip near duodenal bulb. ? ?Complications: No immediate complications noted. ?    ?EBL: Minimal ? ?Plan: Admission per Hospitalist and may use Cortrak for feedings.  ? ? ?Keynan Heffern R. Anselm Pancoast, MD  ?Pager: [phone] ? ? ? ?  ?

## 2021-03-17 NOTE — H&P (Signed)
?History and Physical  ? ? ?[name] HWE:[id number] DOB: [birth year] DOA: 03/17/2021 ? ?PCP: Shirline Frees, MD (Confirm with patient/family/NH records and if not entered, this has to be entered at [facility] point of entry) ?Patient coming from: Home ? ?I have personally briefly reviewed patient's old medical records in [place] ? ?Chief Complaint: can not eat or drink ? ?HPI: Shawn Rhodes is a 46 y.o. 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  Patient has had increasing dysphagia and weight loss > 30 pounds recently. ? ?The patient was first diagnosed with oral cavity squamous cell carcinoma last summer underwent right partial glossectomy with right neck dissection/resection with partial glossectomy in July - August 2022 at [facility].  But patient declined postoperation no adjuvant radiotherapy.  After that patient underwent surveillance from August to January 2023.  In January 2023, PET scan showed signs of cancer recurrence of 2.7 cm tongue mass with 1.7 cm right level 2 node, and patient was offered chemotherapy to shrink the tumor followed by further surgery.  Patient however concerned about further surgery will likely further compromise the tongue structure and function loss, has not started the chemotherapy and has been seeking second opinion at [facility] and [facility] and yet to make a decision for the next step of treatment. ? ?Gradually in the last 2 to 3 months, patient has gradually developed problems swallowing, initially was solid foods, recent few weeks, patient started to have trouble swallowing liquids, mother reported occasional coughing and vomiting of liquid.  His oral intake has reduced to liquid only and patient has lost more than 30 pounds since January.  Patient has severe oral/tongue pain on liquid pain medications. ? ?Today patient went to IR for  elective PEG tube insertion referred by his GI Dr. Watt Climes.  G-tube insertion was not successful due to difficult anatomy and high position of the stomach behind ribs. ? ?Past Medical History:  ?Diagnosis Date  ? Cancer [facility])   ? Diabetes mellitus without complication ([place])   ? type I  ? ? ?Past Surgical History:  ?Procedure Laterality Date  ? PARTIAL GLOSSECTOMY  07/30/2020  ? with right neck dissection  ? WISDOM TOOTH EXTRACTION    ? ? ? reports that he has never smoked. He has never used smokeless tobacco. He reports current alcohol use. He reports that he does not use drugs. ? ?No Known Allergies ? ?No family history on file. ? ? ?Prior to Admission medications   ?Medication Sig Start Date End Date Taking? Authorizing Provider  ?insulin aspart (NOVOLOG FLEXPEN) 100 UNIT/ML FlexPen Inject 1-15 Units into the skin 3 (three) times daily with meals.    [provider]  ?insulin glargine (LANTUS SOLOSTAR) 100 UNIT/ML Solostar Pen Inject 8-10 Units into the skin 2 (two) times daily.    [provider]  ?lisinopril (ZESTRIL) 5 MG tablet Take 5 mg by mouth daily.    [provider]  ? ? ?Physical Exam: ?There were no vitals filed for this visit. ? ?Constitutional: NAD, calm, comfortable ?There were no vitals filed for this visit. ?Eyes: PERRL, lids and conjunctivae normal ?ENMT: Mucous membranes are dry. Posterior pharynx clear of any exudate or lesions.Normal dentition.  ?Neck: normal, supple, no masses, no thyromegaly ?Respiratory: clear to auscultation bilaterally, no wheezing, no crackles. Normal respiratory effort. No accessory muscle use.  ?Cardiovascular: Regular rate and  rhythm, no murmurs / rubs / gallops. No extremity edema. 2+ pedal pulses. No carotid bruits.  ?Abdomen: no tenderness, no masses palpated. No hepatosplenomegaly. Bowel sounds positive.  ?Musculoskeletal: no clubbing / cyanosis. No joint deformity upper and lower extremities. Good ROM, no contractures. Normal muscle tone.   ?Skin: no rashes, lesions, ulcers. No induration ?Neurologic: CN 2-12 grossly intact. Sensation intact, DTR normal. Strength 5/5 in all 4.  ?Psychiatric: Normal judgment and insight. Alert and oriented x 3. Normal mood.  ? ? ? ?Labs on Admission: I have personally reviewed following labs and imaging studies ? ?CBC: ?Recent Labs  ?Lab 03/17/21 ?0945  ?WBC 8.3  ?NEUTROABS 6.5  ?HGB 13.7  ?HCT 40.3  ?MCV 86.5  ?PLT 296  ? ?Basic Metabolic Panel: ?No results for input(s): NA, K, CL, CO2, GLUCOSE, BUN, CREATININE, CALCIUM, MG, PHOS in the last 168 hours. ?GFR: ?Estimated Creatinine Clearance: 98.8 mL/min (by C-G formula based on SCr of 0.86 mg/dL). ?Liver Function Tests: ?No results for input(s): AST, ALT, ALKPHOS, BILITOT, PROT, ALBUMIN in the last 168 hours. ?No results for input(s): LIPASE, AMYLASE in the last 168 hours. ?No results for input(s): AMMONIA in the last 168 hours. ?Coagulation Profile: ?Recent Labs  ?Lab 03/17/21 ?0945  ?INR 1.1  ? ?Cardiac Enzymes: ?No results for input(s): CKTOTAL, CKMB, CKMBINDEX, TROPONINI in the last 168 hours. ?BNP (last 3 results) ?No results for input(s): PROBNP in the last 8760 hours. ?HbA1C: ?No results for input(s): HGBA1C in the last 72 hours. ?CBG: ?Recent Labs  ?Lab 03/17/21 ?5409  ?GLUCAP 109*  ? ?Lipid Profile: ?No results for input(s): CHOL, HDL, LDLCALC, TRIG, CHOLHDL, LDLDIRECT in the last 72 hours. ?Thyroid Function Tests: ?No results for input(s): TSH, T4TOTAL, FREET4, T3FREE, THYROIDAB in the last 72 hours. ?Anemia Panel: ?No results for input(s): VITAMINB12, FOLATE, FERRITIN, TIBC, IRON, RETICCTPCT in the last 72 hours. ?Urine analysis: ?No results found for: COLORURINE, APPEARANCEUR, [place], [place], [place], [employer], [place], KETONESUR, PROTEINUR, [place], NITRITE, LEUKOCYTESUR ? ?Radiological Exams on Admission: ?No results found. ? ?EKG: None ? ?Assessment/Plan ?Active Problems: ?  * No active hospital problems. * ? (please populate well all problems  here in Problem List. (For example, if patient is on BP meds at home and you resume or decide to hold them, it is a problem that needs to be her. Same for CAD, COPD, HLD and so on) ? ?Failure to thrive ?-Secondary to worsening of dysphagia, oral phase secondary to oral/lingual squamous carcinoma, stage III as per oncology from [facility] records. ?-Unlikely dysphagia will improve until the tongue cancer is treated with either chemotherapy or surgery.  However patient's nutrition status is worrisome for chemotherapy, with worsening of nutrition status and weight loss, feeding tube indicated. Long discussion with on call surgeon Dr. Donne Hazel, who recommend I find out from his oncology about the treatment plan. I then called patient's oncologist Dr. Maxie Better at [facility] [phone]), and went through patient's Hx and treatment plan. It appears that patient has not started chemotherapy oncology recommended in January, instead, the patient and family have been seeking second opinion at [facility] and [facility] for possible new therapy options and not yet decided what will be the next move. On the other hand, both the patient and his mother raised the concerns about patient's nutrition status to tolerating any chemo therapy at this moment. I then discussed with on call surgery Dr. Donne Hazel again with all above updated information from [facility] oncology and patient and family, who maintains that patient will need  a comprehensive oncology plan and a formal ENT consult, before PEG tube procedure. Then I went back to discuss the option of possible oncology consult with oncologist from [facility], neither patient nor family are ready to consider changing his oncology care to [facility] as of now. ?-With PEG tube not an option, both patient and family agree with Dobhoff feeding tube and starting NGT feeding and go home for an outpatient PEG with surgery at [facility]. ?-Consult dietitian to start tube feeding. ? ?Severe  dehydration ?-Start IV fluid for 24 hours ? 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 ? ?IDDM ?-'s home dosage of insulin has been cut down by family to avoid hypoglycemia. ?-Sliding scale for now ? ?D

## 2021-03-17 NOTE — Sedation Documentation (Signed)
Dr Anselm Pancoast was unable to put the gastrostomy tube in due to patient anatomy. ?

## 2021-03-17 NOTE — Sedation Documentation (Signed)
He has a glucose monitor on his abdomen. He checked his sugar and it was 243. He and his mom are managing his glucose and he gave himself a dose insulin with his insulin pen. His mom checked the dose prior. ?

## 2021-03-17 NOTE — Sedation Documentation (Signed)
Patient has post nasal drip, causing him to cough. Cannot lie flat. Will put a wedge under chest and head so he is not flat for procedure ? ?

## 2021-03-17 NOTE — H&P (Signed)
History and Physical    Shawn Rhodes DJS:[id number] DOB: 13-Jul-1975 DOA: 03/17/2021  PCP: Shirline Frees, MD (Confirm with patient/family/NH records and if not entered, this has to be entered at [facility] point of entry) Patient coming from: Home  I have personally briefly reviewed patient's old medical records in [place]  Chief Complaint: can not eat or drink  HPI: Shawn Rhodes is a 46 y.o. 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  Patient has had increasing dysphagia and weight loss > 30 pounds recently.  The patient was first diagnosed with oral cavity squamous cell carcinoma last summer underwent right partial glossectomy with right neck dissection/resection with partial glossectomy in July - August 2022 at [facility]  But patient declined postoperation no adjuvant radiotherapy.  After that patient underwent surveillance from August to January 2023.  In January 2023, PET scan showed signs of cancer recurrence of 2.7 cm tongue mass with 1.7 cm right level 2 node, and patient was offered chemotherapy to shrink the tumor followed by further surgery.  Patient however concerned about further surgery will likely further compromise the tongue structure and function loss, has not started the chemotherapy and has been seeking second opinion at [facility] and [facility] and yet to make a decision for the next step of treatment.  Gradually in the last 2 to 3 months, patient has gradually developed problems swallowing, initially was solid foods, recent few weeks, patient started to have trouble swallowing liquids, mother reported occasional coughing and vomiting of liquid.  His oral intake has reduced to liquid only and patient has lost more than 30 pounds since January.  Patient has severe oral/tongue pain on liquid pain medications.  Today patient went to IR for  elective PEG tube insertion referred by his GI Dr. Watt Climes.  G-tube insertion was not successful due to difficult anatomy and high position of the stomach behind ribs.  Past Medical History:  Diagnosis Date   Cancer ([place])    Diabetes mellitus without complication ([place])    type I    Past Surgical History:  Procedure Laterality Date   PARTIAL GLOSSECTOMY  07/30/2020   with right neck dissection   WISDOM TOOTH EXTRACTION       reports that he has never smoked. He has never used smokeless tobacco. He reports current alcohol use. He reports that he does not use drugs.  No Known Allergies  No family history on file.   Prior to Admission medications   Medication Sig Start Date End Date Taking? Authorizing Provider  insulin aspart (NOVOLOG FLEXPEN) 100 UNIT/ML FlexPen Inject 1-15 Units into the skin 3 (three) times daily with meals.    [provider]  insulin glargine (LANTUS SOLOSTAR) 100 UNIT/ML Solostar Pen Inject 8-10 Units into the skin 2 (two) times daily.    [provider]  lisinopril (ZESTRIL) 5 MG tablet Take 5 mg by mouth daily.    [provider]    Physical Exam: There were no vitals filed for this visit.  Constitutional: NAD, calm, comfortable There were no vitals filed for this visit. Eyes: PERRL, lids and conjunctivae normal ENMT: Mucous membranes are dry. Posterior pharynx clear of any exudate or lesions.Normal dentition.  Neck: normal, supple, no masses, no thyromegaly Respiratory: clear to auscultation bilaterally, no wheezing, no crackles. Normal respiratory effort. No accessory muscle use.  Cardiovascular: Regular rate and  rhythm, no murmurs / rubs / gallops. No extremity edema. 2+ pedal pulses. No carotid bruits.  Abdomen: no tenderness, no masses palpated. No hepatosplenomegaly. Bowel sounds positive.  Musculoskeletal: no clubbing / cyanosis. No joint deformity upper and lower extremities. Good ROM, no contractures. Normal muscle tone.   Skin: no rashes, lesions, ulcers. No induration Neurologic: CN 2-12 grossly intact. Sensation intact, DTR normal. Strength 5/5 in all 4.  Psychiatric: Normal judgment and insight. Alert and oriented x 3. Normal mood.     Labs on Admission: I have personally reviewed following labs and imaging studies  CBC: Recent Labs  Lab 03/17/21 0945  WBC 8.3  NEUTROABS 6.5  HGB 13.7  HCT 40.3  MCV 86.5  PLT 035   Basic Metabolic Panel: No results for input(s): NA, K, CL, CO2, GLUCOSE, BUN, CREATININE, CALCIUM, MG, PHOS in the last 168 hours. GFR: Estimated Creatinine Clearance: 98.8 mL/min (by C-G formula based on SCr of 0.86 mg/dL). Liver Function Tests: No results for input(s): AST, ALT, ALKPHOS, BILITOT, PROT, ALBUMIN in the last 168 hours. No results for input(s): LIPASE, AMYLASE in the last 168 hours. No results for input(s): AMMONIA in the last 168 hours. Coagulation Profile: Recent Labs  Lab 03/17/21 0945  INR 1.1   Cardiac Enzymes: No results for input(s): CKTOTAL, CKMB, CKMBINDEX, TROPONINI in the last 168 hours. BNP (last 3 results) No results for input(s): PROBNP in the last 8760 hours. HbA1C: No results for input(s): HGBA1C in the last 72 hours. CBG: Recent Labs  Lab 03/17/21 0914  GLUCAP 109*   Lipid Profile: No results for input(s): CHOL, HDL, LDLCALC, TRIG, CHOLHDL, LDLDIRECT in the last 72 hours. Thyroid Function Tests: No results for input(s): TSH, T4TOTAL, FREET4, T3FREE, THYROIDAB in the last 72 hours. Anemia Panel: No results for input(s): VITAMINB12, FOLATE, FERRITIN, TIBC, IRON, RETICCTPCT in the last 72 hours. Urine analysis: No results found for: COLORURINE, APPEARANCEUR, LABSPEC, PHURINE, GLUCOSEU, HGBUR, BILIRUBINUR, KETONESUR, PROTEINUR, UROBILINOGEN, NITRITE, LEUKOCYTESUR  Radiological Exams on Admission: No results found.  EKG: None  Assessment/Plan Active Problems:   * No active hospital problems. *  (please populate well all problems  here in Problem List. (For example, if patient is on BP meds at home and you resume or decide to hold them, it is a problem that needs to be her. Same for CAD, COPD, HLD and so on)  Failure to thrive -Secondary to worsening of dysphagia, oral phase secondary to oral/lingual squamous carcinoma, stage III as per oncology from [facility] records. -Unlikely dysphagia will improve until the tongue cancer is treated with either chemotherapy or surgery.  However patient's nutrition status is worrisome for chemotherapy, with worsening of nutrition status and weight loss, feeding tube indicated. Long discussion with on call surgeon Dr. Donne Hazel, who recommend I find out from his oncology about the treatment plan. I then called patient's oncologist Dr. Maxie Better at [facility] [phone]), and went through patient's Hx and treatment plan. It appears that patient has not started chemotherapy oncology recommended in January, instead, the patient and family have been seeking second opinion at [facility] and [facility] for possible new therapy options and not yet decided what will be the next move. On the other hand, both the patient and his mother raised the concerns about patient's nutrition status to tolerating any chemo therapy at this moment. I then discussed with on call surgery Dr. Donne Hazel again with all above updated information from [facility] oncology and patient and family, who maintains that patient will need  a comprehensive oncology plan and a formal ENT consult, before PEG tube procedure. Then I went back to discuss the option of possible oncology consult with oncologist from [facility], neither patient nor family are ready to consider changing his oncology care to [facility] as of now. -With PEG tube not an option, both patient and family agree with Dobhoff feeding tube and starting NGT feeding and go home for an outpatient PEG with surgery at [place]. -Consult dietitian to start tube feeding.  Severe  dehydration -Start IV fluid for 24 hours  Oral cavity squamous cell carcinoma stage III -As above.  IDDM -'s home dosage of insulin has been cut down by family to avoid hypoglycemia. -Sliding scale for now  DVT prophylaxis: SCD Code Status: Full code Family Communication: Mother at bedside Disposition Plan: Expect less than two midnight hospital stay Consults called: IR, general surgery Admission status: Medsurg observation   Lequita Halt MD [facility] Pager [phone]  03/17/2021, 2:48 PM

## 2021-03-17 NOTE — Progress Notes (Signed)
Start Glucerna ? ?Consult dietitian and case manager for feeding pump and supplies at home. ?

## 2021-03-18 ENCOUNTER — Encounter (HOSPITAL_COMMUNITY): Payer: Self-pay | Admitting: Internal Medicine

## 2021-03-18 DIAGNOSIS — Z79899 Other long term (current) drug therapy: Secondary | ICD-10-CM | POA: Diagnosis not present

## 2021-03-18 DIAGNOSIS — R627 Adult failure to thrive: Secondary | ICD-10-CM

## 2021-03-18 DIAGNOSIS — C021 Malignant neoplasm of border of tongue: Secondary | ICD-10-CM | POA: Diagnosis not present

## 2021-03-18 DIAGNOSIS — Z794 Long term (current) use of insulin: Secondary | ICD-10-CM | POA: Diagnosis not present

## 2021-03-18 DIAGNOSIS — R6339 Other feeding difficulties: Secondary | ICD-10-CM | POA: Diagnosis not present

## 2021-03-18 DIAGNOSIS — Z20822 Contact with and (suspected) exposure to covid-19: Secondary | ICD-10-CM | POA: Diagnosis not present

## 2021-03-18 DIAGNOSIS — R1310 Dysphagia, unspecified: Secondary | ICD-10-CM | POA: Diagnosis not present

## 2021-03-18 DIAGNOSIS — E119 Type 2 diabetes mellitus without complications: Secondary | ICD-10-CM | POA: Diagnosis not present

## 2021-03-18 DIAGNOSIS — E860 Dehydration: Secondary | ICD-10-CM | POA: Diagnosis not present

## 2021-03-18 DIAGNOSIS — R1311 Dysphagia, oral phase: Secondary | ICD-10-CM | POA: Diagnosis not present

## 2021-03-18 DIAGNOSIS — C029 Malignant neoplasm of tongue, unspecified: Secondary | ICD-10-CM | POA: Diagnosis not present

## 2021-03-18 LAB — BASIC METABOLIC PANEL
Anion gap: 9 (ref 5–15)
BUN: 27 mg/dL — ABNORMAL HIGH (ref 6–20)
CO2: 25 mmol/L (ref 22–32)
Calcium: 8.4 mg/dL — ABNORMAL LOW (ref 8.9–10.3)
Chloride: 108 mmol/L (ref 98–111)
Creatinine, Ser: 1.03 mg/dL (ref 0.61–1.24)
GFR, Estimated: >60 mL/min (ref >60)
Glucose, Bld: 162 mg/dL — ABNORMAL HIGH (ref 70–99)
Potassium: 3.9 mmol/L (ref 3.5–5.1)
Sodium: 142 mmol/L (ref 135–145)

## 2021-03-18 LAB — HEPATIC FUNCTION PANEL
ALT: 85 U/L — ABNORMAL HIGH (ref 0–44)
AST: 48 U/L — ABNORMAL HIGH (ref 15–41)
Albumin: 2.9 g/dL — ABNORMAL LOW (ref 3.5–5.0)
Alkaline Phosphatase: 73 U/L (ref 38–126)
Bilirubin, Direct: 0.2 mg/dL (ref 0.0–0.2)
Indirect Bilirubin: 0.8 mg/dL (ref 0.3–0.9)
Total Bilirubin: 1 mg/dL (ref 0.3–1.2)
Total Protein: 5.9 g/dL — ABNORMAL LOW (ref 6.5–8.1)

## 2021-03-18 LAB — SARS CORONAVIRUS 2 BY RT PCR (HOSPITAL ORDER, PERFORMED IN ~~LOC~~ HOSPITAL LAB): SARS Coronavirus 2: NEGATIVE

## 2021-03-18 LAB — PHOSPHORUS: Phosphorus: 2.3 mg/dL — ABNORMAL LOW (ref 2.5–4.6)

## 2021-03-18 LAB — HIV ANTIBODY (ROUTINE TESTING W REFLEX): HIV Screen 4th Generation wRfx: NONREACTIVE

## 2021-03-18 LAB — MAGNESIUM: Magnesium: 1.9 mg/dL (ref 1.7–2.4)

## 2021-03-18 MED ORDER — INSULIN ASPART 100 UNIT/ML IJ SOLN
0.0000 [IU] | INTRAMUSCULAR | Status: DC
  Administered 2021-03-18: 16:00:00 5 [IU] via SUBCUTANEOUS
  Administered 2021-03-18: 3 [IU] via SUBCUTANEOUS
  Administered 2021-03-19: 7 [IU] via SUBCUTANEOUS
  Administered 2021-03-19: 2 [IU] via SUBCUTANEOUS
  Administered 2021-03-19: 5 [IU] via SUBCUTANEOUS
  Administered 2021-03-19 – 2021-03-20 (×2): 3 [IU] via SUBCUTANEOUS
  Administered 2021-03-20: 5 [IU] via SUBCUTANEOUS
  Administered 2021-03-20: 7 [IU] via SUBCUTANEOUS
  Filled 2021-03-18: qty 1

## 2021-03-18 MED ORDER — MAGNESIUM SULFATE 2 GM/50ML IV SOLN
2.0000 g | Freq: Once | INTRAVENOUS | Status: AC
  Administered 2021-03-18: 2 g via INTRAVENOUS
  Filled 2021-03-18: qty 50

## 2021-03-18 MED ORDER — POTASSIUM PHOSPHATES 15 MMOLE/5ML IV SOLN
30.0000 mmol | Freq: Once | INTRAVENOUS | Status: AC
  Administered 2021-03-18: 30 mmol via INTRAVENOUS
  Filled 2021-03-18: qty 10

## 2021-03-18 MED ORDER — CEFAZOLIN SODIUM-DEXTROSE 2-4 GM/100ML-% IV SOLN
2.0000 g | INTRAVENOUS | Status: AC
  Administered 2021-03-19: 2 g via INTRAVENOUS
  Filled 2021-03-18: qty 100

## 2021-03-18 MED ORDER — FREE WATER
100.0000 mL | Status: DC
  Administered 2021-03-18 – 2021-03-20 (×9): 100 mL

## 2021-03-18 MED ORDER — GABAPENTIN 250 MG/5ML PO SOLN
300.0000 mg | Freq: Three times a day (TID) | ORAL | Status: DC
Start: 2021-03-18 — End: 2021-03-20
  Administered 2021-03-18 – 2021-03-20 (×7): 300 mg
  Filled 2021-03-18 (×11): qty 6

## 2021-03-18 MED ORDER — PROSOURCE TF PO LIQD
45.0000 mL | Freq: Every day | ORAL | Status: DC
  Administered 2021-03-20: 45 mL
  Filled 2021-03-18: qty 45

## 2021-03-18 MED ORDER — ENOXAPARIN SODIUM 40 MG/0.4ML IJ SOSY
40.0000 mg | PREFILLED_SYRINGE | INTRAMUSCULAR | Status: DC
Start: 2021-03-18 — End: 2021-03-20
  Administered 2021-03-18 – 2021-03-20 (×3): 40 mg via SUBCUTANEOUS
  Filled 2021-03-18 (×3): qty 0.4

## 2021-03-18 MED ORDER — OSMOLITE 1.5 CAL PO LIQD
1000.0000 mL | ORAL | Status: DC
  Administered 2021-03-18: 18:00:00 1000 mL
  Filled 2021-03-18 (×2): qty 1000

## 2021-03-18 NOTE — Progress Notes (Signed)
Initial Nutrition Assessment ? ?DOCUMENTATION CODES:  ? ?Not applicable ? ?INTERVENTION:  ? ?Osmolite 1.5 _0 /hr- Initiate at 65m/hr and advance by 163mhr q 8 hours until goal rate is reached.  ? ?Pro-Source 4568maily via tube, provides 40kcal and 11g of protein per serving  ? ?Free water flushes 100m54m hours  ? ?Regimen provides 2380kcal/day, 109g/day protein and 1789ml26m of free water ? ?Pt at high refeed risk; recommend monitor potassium, magnesium and phosphorus labs daily until stable ? ?NUTRITION DIAGNOSIS:  ? ?Inadequate oral intake related to cancer and cancer related treatments as evidenced by NPO status. ? ?GOAL:  ? ?Patient will meet greater than or equal to 90% of their needs ? ?MONITOR:  ? ?Labs, Weight trends, TF tolerance, Skin, I & O's ? ?REASON FOR ASSESSMENT:  ? ?Consult ?Enteral/tube feeding initiation and management ? ?ASSESSMENT:  ? ?45 y/38male with h/o IDDM, multiple myeloma and SCC of the tongue s/p partial glossectomy with selective right Lvl 2-4 neck dissection 07/2020 and revision parital glosectomy 08/2020 s/p PET scan in Jan 2023 with concerns for recurrence and increased uptake in tongue and R cervical node with no treatment plan yet as family still getting second opionions who is admitted with FTT. ? ?RD working remotely. ? ?Unable to reach pt by phone. Per chart review, pt with poor appetite and oral intake for 2-3 weeks pta r/ mouth pain. Plan was for IR G-tube today but tube was unable to be placed r/t anatomy issues. NGT was placed instead and is positioned at the duodenal bulb. Plan is to start tube feeds today. Pt is at high refeed risk. Plan is for surgically placed G-tube. Will initiate continuous feeds for now as pt with NGT; will plan to switch to bolus feeds once G-tube in place. Per chart review, pt reports a recent 30lb weight loss. Per chart, pt is down 15lbs(10%) since August; RN unsure how recently weight loss occurred. Pt does appear to have weighed 184lbs  several years ago. RD will obtain nutrition related history and exam at follow-up.  ? ?Medications reviewed and include: lovenox, insulin, cefazolin, Kphos ? ?Labs reviewed: K 3.9 wnl, BUN 27(H), P 2.3(L), Mg 1.9 wnl ?AIC 7.2(H)- 07/2020 ? ?NUTRITION - FOCUSED PHYSICAL EXAM: ?Unable to perform at this time  ? ?Diet Order:   ?Diet Order   ? ?       ?  Diet NPO time specified  Diet effective midnight       ?  ?  Diet NPO time specified  Diet effective now       ?  ? ?  ?  ? ?  ? ?EDUCATION NEEDS:  ? ?Not appropriate for education at this time ? ?Skin:  Skin Assessment: Reviewed RN Assessment (incision abdomen) ? ?Last BM:  PTA ? ?Height:  ? ?Ht Readings from Last 1 Encounters:  ?03/17/21 _1  (1.778 m)  ? ? ?Weight:  ? ?Wt Readings from Last 1 Encounters:  ?03/17/21 65.7 kg  ? ? ?Ideal Body Weight:  75.4 kg ? ?BMI:  Body mass index is 20.78 kg/m?. ? ?Estimated Nutritional Needs:  ? ?Kcal:  2000-2300kcal/day ? ?Protein:  100-115g/day ? ?Fluid:  2.0-2.3L/day ? ?CaseyKoleen DistanceRD, LDN ?Please refer to AM[facility] for RD and/or RD on-call/weekend/after hours pager ? ?

## 2021-03-18 NOTE — Progress Notes (Addendum)
PROGRESS NOTE        PATIENT DETAILS Name: Shawn Rhodes Age: 46 y.o. Sex: male Date of Birth: [birth year] Admit Date: 03/17/2021 Admitting Physician Lequita Halt, MD [id number], [name], MD  Brief Summary: Patient is a 46 y.o.  male with head and neck cancer (not yet established with oncology as patient/mother still in the process of getting multiple opinions from different medical centers)-presented to IR for an elective outpatient PEG tube placement (increasing dysphagia, weight loss, minimal oral intake)-unfortunately radiology was not able to place PEG tube, Cortak tube was placed-and patient was admitted to the hospitalist service.  See below for further details.   Significant events: 3/10>> admit to hospitalist service-after IR unable to place PEG-has cortrak tube  Significant studies:   Significant microbiology data:   Procedures:   Consults: Multiple conversations with general surgery PA-C-Michael Maczis (surgery needs a note from oncology or ENT with oncology indication for PEG tube)  Telephone consult with oncology-Dr Burr Medico Awaiting callback from ENT-Dr. Melene Plan (left VM)  Subjective: Lying comfortably in bed-denies any chest pain or shortness of breath.  Objective: Vitals: Blood pressure 122/70, pulse (!) 59, temperature 98.2 F (36.8 C), temperature source Axillary, resp. rate 16, height '5\' 10"'$  (1.778 m), weight 65.7 kg, SpO2 92 %.   Exam: Gen Exam:Alert awake-not in any distress HEENT:atraumatic, normocephalic Chest: B/L clear to auscultation anteriorly CVS:S1S2 regular Abdomen:soft non tender, non distended Extremities:no edema Neurology: Non focal Skin: no rash  Pertinent Labs/Radiology: CBC Latest Ref Rng & Units 03/17/2021 03/08/2021 08/30/2020  WBC 4.0 - 10.5 K/uL 8.3 8.4 8.0  Hemoglobin 13.0 - 17.0 g/dL 13.7 14.1 12.9(L)  Hematocrit 39.0 - 52.0 % 40.3 40.8 39.4  Platelets 150 - 400 K/uL 296 349 345    Lab Results   Component Value Date   NA 142 03/18/2021   K 3.9 03/18/2021   CL 108 03/18/2021   CO2 25 03/18/2021     Assessment/Plan: Dysphagia/poor oral intake/weight loss/failure to thrive syndrome: Due to underlying head/neck tumor-IR unable to place PEG tube-conversations ongoing with general surgery/oncology to see if patient can get a PEG tube placed by general surgery.  Unfortunately-he cannot be discharged home with a Cortak tube (see CM note).  In the meantime-continue feeds via Cortak tube-watch for refeeding syndrome.  Obtain a formal SLP eval to see if patient can have some minimal/oral intake as well.  Hypophosphatemia: Replete.  Insulin-dependent DM-2: Check A1c with a.m. labs-continue SSI.  Recent Labs    03/17/21 0914  GLUCAP 109*   BMI: Estimated body mass index is 20.78 kg/m as calculated from the following:   Height as of this encounter: '5\' 10"'$  (1.778 m).   Weight as of this encounter: 65.7 kg.   Code status:   Code Status: Full Code   DVT Prophylaxis: SCDs Start: 03/17/21 1628   Family Communication: None at bedside   Disposition Plan: Status is: Observation The patient will require care spanning > 2 midnights and should be moved to inpatient because: Cannot be discharged with NG tube-discussions ongoing to see if patient can get open PEG tube placement by general surgery early next week.   Planned Discharge Destination:Home   Diet: Diet Order             Diet NPO time specified  Diet effective now  Antimicrobial agents: Anti-infectives (From admission, onward)    None        MEDICATIONS: Scheduled Meds:  gabapentin  300 mg Per Tube Q8H   insulin aspart  0-15 Units Subcutaneous TID WC   Continuous Infusions:  sodium chloride 150 mL/hr at 03/18/21 0500   feeding supplement (GLUCERNA 1.2 CAL) 1,000 mL (03/17/21 1837)   PRN Meds:.ibuprofen, oxyCODONE   I have personally reviewed following labs and imaging  studies  LABORATORY DATA: CBC: Recent Labs  Lab 03/17/21 0945  WBC 8.3  NEUTROABS 6.5  HGB 13.7  HCT 40.3  MCV 86.5  PLT 941    Basic Metabolic Panel: Recent Labs  Lab 03/18/21 0114 03/18/21 0650  NA 142  --   K 3.9  --   CL 108  --   CO2 25  --   GLUCOSE 162*  --   BUN 27*  --   CREATININE 1.03  --   CALCIUM 8.4*  --   MG  --  1.9  PHOS  --  2.3*    GFR: Estimated Creatinine Clearance: 82.4 mL/min (by C-G formula based on SCr of 1.03 mg/dL).  Liver Function Tests: Recent Labs  Lab 03/18/21 0650  AST 48*  ALT 85*  ALKPHOS 73  BILITOT 1.0  PROT 5.9*  ALBUMIN 2.9*   No results for input(s): LIPASE, AMYLASE in the last 168 hours. No results for input(s): AMMONIA in the last 168 hours.  Coagulation Profile: Recent Labs  Lab 03/17/21 0945  INR 1.1    Cardiac Enzymes: No results for input(s): CKTOTAL, CKMB, CKMBINDEX, TROPONINI in the last 168 hours.  BNP (last 3 results) No results for input(s): PROBNP in the last 8760 hours.  Lipid Profile: No results for input(s): CHOL, HDL, LDLCALC, TRIG, CHOLHDL, LDLDIRECT in the last 72 hours.  Thyroid Function Tests: No results for input(s): TSH, T4TOTAL, FREET4, T3FREE, THYROIDAB in the last 72 hours.  Anemia Panel: No results for input(s): VITAMINB12, FOLATE, FERRITIN, TIBC, IRON, RETICCTPCT in the last 72 hours.  Urine analysis: No results found for: COLORURINE, APPEARANCEUR, LABSPEC, PHURINE, GLUCOSEU, HGBUR, BILIRUBINUR, KETONESUR, PROTEINUR, UROBILINOGEN, NITRITE, LEUKOCYTESUR  Sepsis Labs: Lactic Acid, Venous No results found for: LATICACIDVEN  MICROBIOLOGY: No results found for this or any previous visit (from the past 240 hour(s)).  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 Malnutrition due to difficulty eating and drinking. EXAM: ATTEMPTED PERCUTANEOUS GASTROSTOMY TUBE PLACEMENT WITH  FLUOROSCOPY AND CONE BEAM CT Physician: Stephan Minister. Anselm Pancoast, MD MEDICATIONS: Ancef 2 g; Antibiotics were administered within 1 hour of the procedure. Glucagon 0.5 mg ANESTHESIA/SEDATION: Moderate (conscious) sedation was employed during this procedure. A total of Versed 3.'0mg'$  and fentanyl 150 mcg was administered intravenously at the order of the provider performing the procedure. Total intra-service moderate sedation time: 44 minutes. Patient's level of consciousness and vital signs were monitored continuously by radiology nurse throughout the procedure under the supervision of the provider performing the procedure. FLUOROSCOPY: Radiation Exposure Index (as provided by the fluoroscopic device): 65 mGy Kerma COMPLICATIONS: None immediate. PROCEDURE: The procedure was explained to the patient. The risks and benefits of the procedure were discussed and the patient's questions were addressed. Informed consent was obtained from the patient. Patient was placed on the interventional table. A 5 French nasogastric tube was easily advanced into the stomach using fluoroscopy. The anterior abdomen was prepped and draped in sterile fashion. Maximal barrier sterile technique was utilized  including caps, mask, sterile gowns, sterile gloves, sterile drape, hand hygiene and skin antiseptic. This stomach was insufflated with gas. Patient has a large amount of bowel gas throughout the abdomen. Stomach has a horizontal orientation and majority the stomach is underneath the xiphoid process and anterior ribs. Skin was anesthetized with 1% lidocaine. Attempted to advance a needle into the stomach but needle placement would require a severe caudal to cranial angulation. Unfortunately, there were multiple gas-filled loops of bowel just underneath the stomach and lateral view raised concern that the needle may traverse a loop of bowel prior to entering the stomach. Therefore, cone beam CT was performed in order to better evaluate the anatomy.  Additional gas was placed in the stomach but a safe percutaneous window could not be identified for gastrostomy tube placement. Therefore, the gastrostomy tube procedure was not performed. FINDINGS: Stomach has a very horizontal orientation and despite insufflation with air, majority of the stomach is underneath the patient's anterior ribs and the xiphoid process. In addition, there is a large amount of gas-filled bowel underneath the stomach which limits our ability to advance the needle into the stomach from a caudal to cranial orientation. Safe percutaneous window was not identified for gastrostomy tube placement. IMPRESSION: 1. Percutaneous gastrostomy tube procedure was aborted due to difficult anatomy and cannot identify a safe percutaneous window for gastrostomy tube placement. Recommend surgical consultation for surgical gastrostomy or jejunal feeding tube. Electronically Signed   By: Markus Daft M.D.   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 Percutaneous gastrostomy tube procedure was aborted due to difficult anatomy and could not identify a safe percutaneous window. EXAM: PLACEMENT OF NASOGASTRIC FEEDING TUBE WITH FLUOROSCOPY Physician: Stephan Minister. Anselm Pancoast, MD MEDICATIONS: None ANESTHESIA/SEDATION: None FLUOROSCOPY: Radiation Exposure Index (as provided by the fluoroscopic device): 35.5 mGy Kerma COMPLICATIONS: None immediate. PROCEDURE: Patient was placed on the interventional table. A Cortrak tube was easily advanced down the right nostril into the stomach with fluoroscopy. The tube was preferentially coiling in the gastric fundus. Eventually, the tube was directed toward the distal stomach and placed near the duodenal bulb. Contrast injection confirmed placement near the duodenal bulb. The tube was flushed with saline. Tube was secured to the patient's nose.  FINDINGS: Feeding tube tip is near the duodenal bulb. IMPRESSION: Successful placement of a nasogastric feeding tube with fluoroscopy. Feeding tube is ready to be used. Electronically Signed   By: Markus Daft M.D.   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 Malnutrition due to difficulty eating and drinking. EXAM: ATTEMPTED PERCUTANEOUS GASTROSTOMY TUBE PLACEMENT WITH FLUOROSCOPY AND CONE BEAM CT Physician: Stephan Minister. Anselm Pancoast, MD MEDICATIONS: Ancef 2 g; Antibiotics were administered within 1 hour of the procedure. Glucagon 0.5 mg ANESTHESIA/SEDATION: Moderate (conscious) sedation was employed during this procedure. A total of Versed 3.'0mg'$  and fentanyl 150 mcg was administered intravenously at the order of the provider performing the procedure. Total intra-service moderate sedation time: 44 minutes. Patient's level of consciousness and vital signs were monitored continuously by radiology nurse throughout the procedure under the supervision of the provider performing the procedure. FLUOROSCOPY: Radiation Exposure Index (as provided by the fluoroscopic device): 65 mGy Kerma COMPLICATIONS: None immediate. PROCEDURE: The procedure was explained to the patient. The risks and benefits of the procedure  were discussed and the patient's questions were addressed. Informed consent was obtained from the patient. Patient was placed on the interventional table. A 5 French nasogastric tube was easily advanced into the stomach using fluoroscopy. The anterior abdomen was prepped and draped in sterile fashion. Maximal barrier sterile technique was utilized including caps, mask, sterile gowns, sterile gloves, sterile drape, hand hygiene and skin antiseptic. This stomach was insufflated with gas. Patient has a large amount of bowel gas throughout the abdomen. Stomach has a horizontal orientation and majority the stomach is underneath the xiphoid  process and anterior ribs. Skin was anesthetized with 1% lidocaine. Attempted to advance a needle into the stomach but needle placement would require a severe caudal to cranial angulation. Unfortunately, there were multiple gas-filled loops of bowel just underneath the stomach and lateral view raised concern that the needle may traverse a loop of bowel prior to entering the stomach. Therefore, cone beam CT was performed in order to better evaluate the anatomy. Additional gas was placed in the stomach but a safe percutaneous window could not be identified for gastrostomy tube placement. Therefore, the gastrostomy tube procedure was not performed. FINDINGS: Stomach has a very horizontal orientation and despite insufflation with air, majority of the stomach is underneath the patient's anterior ribs and the xiphoid process. In addition, there is a large amount of gas-filled bowel underneath the stomach which limits our ability to advance the needle into the stomach from a caudal to cranial orientation. Safe percutaneous window was not identified for gastrostomy tube placement. IMPRESSION: 1. Percutaneous gastrostomy tube procedure was aborted due to difficult anatomy and cannot identify a safe percutaneous window for gastrostomy tube placement. Recommend surgical consultation for surgical gastrostomy or jejunal feeding tube. Electronically Signed   By: Markus Daft M.D.   On: 03/17/2021 17:27     LOS: 1 day   Oren Binet, MD  [facility]    To contact the attending provider between 7A-7P or the covering provider during after hours 7P-7A, please log into the web site [email or web address] and access using universal [place] password for that web site. If you do not have the password, please call the hospital operator.  03/18/2021, 10:35 AM

## 2021-03-18 NOTE — Evaluation (Signed)
Clinical/Bedside Swallow Evaluation Patient Details  Name: Shawn Rhodes MRN: [id number] Date of Birth: 1975/02/20  Today's Date: 03/18/2021 Time: SLP Start Time (ACUTE ONLY): 59 SLP Stop Time (ACUTE ONLY): 1220 SLP Time Calculation (min) (ACUTE ONLY): 50 min  Past Medical History:  Past Medical History:  Diagnosis Date   Cancer ([place])    Diabetes mellitus without complication ([place])    type I   Past Surgical History:  Past Surgical History:  Procedure Laterality Date   IR CT SPINE LTD  03/17/2021   IR GASTROSTOMY TUBE MOD SED  03/17/2021   IR NASO G TUBE PLC W/FL W/RAD  03/17/2021   PARTIAL GLOSSECTOMY  07/30/2020   with right neck dissection   WISDOM TOOTH EXTRACTION     HPI:  Shawn Rhodes is a 46 y.o. 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  Patient has had increasing dysphagia and weight loss > 30 pounds recently. The patient was first diagnosed with oral cavity squamous cell carcinoma last summer underwent right partial glossectomy with right neck dissection/resection with partial glossectomy in July - August 2022 at [facility].  But patient declined postoperation no adjuvant radiotherapy.  After that patient underwent surveillance from August to January 2023.  In January 2023, PET scan showed signs of cancer recurrence of 2.7 cm tongue mass with 1.7 cm right level 2 node, and patient was offered chemotherapy to shrink the tumor followed by further surgery.  Patient however concerned about further surgery will likely further compromise the tongue structure and function loss, has not started the chemotherapy and has been seeking second opinion at [facility] and [facility] and yet to make a decision for the next step of treatment. Gradually in the last 2 to 3 months, patient has gradually developed problems swallowing, initially was solid foods, recent  few weeks, patient started to have trouble swallowing liquids, mother reported occasional coughing and vomiting of liquid.  His oral intake has reduced to liquid only and patient has lost more than 30 pounds since January.  Patient has severe oral/tongue pain on liquid pain medications. Since admission, patient went to IR for elective PEG tube insertion referred by his GI Dr. Watt Climes.  G-tube insertion was not successful due to difficult anatomy and high position of the stomach behind ribs.    Assessment / Plan / Recommendation  Clinical Impression  Patient presents with what SLP suspects is a primary oral dysphagia.  Lingual ROM and strength signficantly impaired and patient also complaining of pain (left ear/jaw) during swallow which he reports is due to an "injury" when he bit his tongue last week. He is able to verbally communicate with moderate dysarthria but 100% intelligibilty to this SLP however he answers mostly via grunting and head nodding, reportedly due to pain. He is also noted to have limited dry swallows at baseline, again reporting secondary to pain, which SLP believes results in a pooling of saliva in oral cavity and above the vocal cords with resultant wet vocal qualtiy and eventual baseline cough. This cough is also noted to be delayed following sips of thin liquid. Based on most recent neck CT and PET scan, the larynx and pharynx are clear of cancer. Question if severity of lingual deficits results in poor control of bolus however and contributes to some decrease in airway protection. Discussed with patient. MBS recomended to evaluate all swallowing physiology. Also  encouraged attempting more verbal communication as well as intake of ice chips/small sips of water after oral care to facilitate use of oropharyngeal musculature. Both patient and mom verbalized understanding. Will plan for MBS on 3/13 as PEG is planned for 3/12. Agree that PEG tube will be needed for nutritional support in  anticipation of treatment for primary disease. SLP Visit Diagnosis: Dysphagia, oral phase (R13.11)    Aspiration Risk  Risk for inadequate nutrition/hydration    Diet Recommendation Ice chips PRN after oral care;Free water protocol after oral care;NPO   Medication Administration: Via alternative means Compensations: Small sips/bites Postural Changes: Seated upright at 90 degrees    Other  Recommendations Oral Care Recommendations: Oral care QID;Oral care prior to ice chip/H20    Recommendations for follow up therapy are one component of a multi-disciplinary discharge planning process, led by the attending physician.  Recommendations may be updated based on patient status, additional functional criteria and insurance authorization.  Follow up Recommendations Outpatient SLP      Assistance Recommended at Discharge None    Swallow Study   General HPI: Shawn Rhodes is a 46 y.o. 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  Patient has had increasing dysphagia and weight loss > 30 pounds recently. The patient was first diagnosed with oral cavity squamous cell carcinoma last summer underwent right partial glossectomy with right neck dissection/resection with partial glossectomy in July - August 2022 at [facility].  But patient declined postoperation no adjuvant radiotherapy.  After that patient underwent surveillance from August to January 2023.  In January 2023, PET scan showed signs of cancer recurrence of 2.7 cm tongue mass with 1.7 cm right level 2 node, and patient was offered chemotherapy to shrink the tumor followed by further surgery.  Patient however concerned about further surgery will likely further compromise the tongue structure and function loss, has not started the chemotherapy and has been seeking second opinion at [facility] and [facility] and yet to  make a decision for the next step of treatment. Gradually in the last 2 to 3 months, patient has gradually developed problems swallowing, initially was solid foods, recent few weeks, patient started to have trouble swallowing liquids, mother reported occasional coughing and vomiting of liquid.  His oral intake has reduced to liquid only and patient has lost more than 30 pounds since January.  Patient has severe oral/tongue pain on liquid pain medications. Since admission, patient went to IR for elective PEG tube insertion referred by his GI Dr. Watt Climes.  G-tube insertion was not successful due to difficult anatomy and high position of the stomach behind ribs. Type of Study: Bedside Swallow Evaluation Previous Swallow Assessment: NONE Diet Prior to this Study: NPO Temperature Spikes Noted: No Respiratory Status: Room air History of Recent Intubation: No Behavior/Cognition: Alert;Cooperative;Pleasant mood Oral Cavity Assessment: Other (comment) (white lingual coating) Oral Care Completed by SLP: Recent completion by staff Oral Cavity - Dentition: Adequate natural dentition Vision: Functional for self-feeding Self-Feeding Abilities: Able to feed self Patient Positioning: Upright in bed Baseline Vocal Quality: Wet (clears with cued throat clear and swallow) Volitional Cough: Strong Volitional Swallow: Able to elicit    Oral/Motor/Sensory Function Overall Oral Motor/Sensory Function: Severe impairment Facial ROM: Within Functional Limits Facial Symmetry: Within Functional Limits Facial Strength: Within Functional Limits Facial Sensation: Within Functional Limits Lingual ROM: Reduced right;Reduced left Lingual Symmetry: Within Functional  Limits Lingual Strength: Reduced Lingual Sensation: Within Functional Limits Velum: Within Functional Limits Mandible: Within Functional Limits   Ice Chips Ice chips: Not tested   Thin Liquid Thin Liquid: Impaired Presentation: Straw;Cup;Self Fed Oral Phase  Impairments: Reduced lingual movement/coordination Oral Phase Functional Implications: Prolonged oral transit;Oral holding Pharyngeal  Phase Impairments: Cough - Delayed    Nectar Thick Nectar Thick Liquid: Not tested   Honey Thick Honey Thick Liquid: Not tested   Puree Puree: Not tested   Solid     Solid: Not tested     Gabriel Rainwater MA, CCC-SLP  Faye Strohman Meryl 03/18/2021,12:41 PM

## 2021-03-18 NOTE — Consult Note (Signed)
Reason for Consult:dysphagia Referring Provider: Oren Binet  Shawn Rhodes is an 46 y.o. male.  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 Benjamine Mola did partial glossectomy with selective right Lvl 2-4 neck dissection 07/2020 and revision parital glosectomy 08/2020. Nodes from selective neck dissection came back 0/9 nodes positive. Follow up PET in Jan 2023 was concerning for recurrence and increased uptake in tongue and R cervical node. He has been working with [facility] but has not started therapy, though initial consult noted treatment to start in February. He got a second opinion from [facility] as well.  In the last month he had had increased difficulty swallowing and talking and has lost 30 pounds. He attempted to have IR place a g tube but due to anatomy they were unsuccessful. He was admitted for NG tube insertion and start tube feeds yesterday.  Past Medical History:  Diagnosis Date   Cancer ([place])    Diabetes mellitus without complication ([place])    type I    Past Surgical History:  Procedure Laterality Date   IR CT SPINE LTD  03/17/2021   IR GASTROSTOMY TUBE MOD SED  03/17/2021   IR NASO G TUBE PLC W/FL W/RAD  03/17/2021   PARTIAL GLOSSECTOMY  07/30/2020   with right neck dissection   WISDOM TOOTH EXTRACTION      History reviewed. No pertinent family history.  Social History:  reports that he has never smoked. He has never used smokeless tobacco. He reports current alcohol use. He reports that he does not use drugs.  Allergies: No Known Allergies  Medications: I have reviewed the patient's current medications.  Results for orders placed or performed during the hospital encounter of 03/17/21 (from the past 48 hour(s))  HIV Antibody (routine testing w rflx)     Status: None   Collection Time: 03/18/21  1:14 AM  Result Value Ref Range   HIV Screen 4th Generation wRfx Non Reactive Non Reactive    Comment: Performed at [facility], [address],  [place], [place] [address]  Basic metabolic panel     Status: Abnormal   Collection Time: 03/18/21  1:14 AM  Result Value Ref Range   Sodium 142 135 - 145 mmol/L   Potassium 3.9 3.5 - 5.1 mmol/L   Chloride 108 98 - 111 mmol/L   CO2 25 22 - 32 mmol/L   Glucose, Bld 162 (H) 70 - 99 mg/dL    Comment: Glucose reference range applies only to samples taken after fasting for at least 8 hours.   BUN 27 (H) 6 - 20 mg/dL   Creatinine, Ser 1.03 0.61 - 1.24 mg/dL   Calcium 8.4 (L) 8.9 - 10.3 mg/dL   GFR, Estimated >60 >60 mL/min    Comment: (NOTE) Calculated using the CKD-EPI Creatinine Equation (2021)    Anion gap 9 5 - 15    Comment: Performed at [facility] [address] [place], [place] [address]  Magnesium     Status: None   Collection Time: 03/18/21  6:50 AM  Result Value Ref Range   Magnesium 1.9 1.7 - 2.4 mg/dL    Comment: Performed at [facility] [address] [place], [place] [address]  Phosphorus     Status: Abnormal   Collection Time: 03/18/21  6:50 AM  Result Value Ref Range   Phosphorus 2.3 (L) 2.5 - 4.6 mg/dL    Comment: Performed at [facility],  [address] [place], [place] [address]  Hepatic function panel     Status: Abnormal   Collection Time: 03/18/21  6:50 AM  Result Value Ref Range   Total Protein 5.9 (L) 6.5 - 8.1 g/dL   Albumin 2.9 (L) 3.5 - 5.0 g/dL   AST 48 (H) 15 - 41 U/L   ALT 85 (H) 0 - 44 U/L   Alkaline Phosphatase 73 38 - 126 U/L   Total Bilirubin 1.0 0.3 - 1.2 mg/dL   Bilirubin, Direct 0.2 0.0 - 0.2 mg/dL   Indirect Bilirubin 0.8 0.3 - 0.9 mg/dL    Comment: Performed at [facility] [address] [place],  [address]    IR GASTROSTOMY TUBE MOD SED  Result Date: 03/17/2021 INDICATION: 46 year old with squamous cell carcinoma of the tongue. Malnutrition due to difficulty eating and drinking. EXAM: ATTEMPTED PERCUTANEOUS GASTROSTOMY TUBE PLACEMENT WITH FLUOROSCOPY AND CONE BEAM CT Physician: Shawn Rhodes. Shawn Pancoast, MD  MEDICATIONS: Ancef 2 g; Antibiotics were administered within 1 hour of the procedure. Glucagon 0.5 mg ANESTHESIA/SEDATION: Moderate (conscious) sedation was employed during this procedure. A total of Versed 3.'0mg'$  and fentanyl 150 mcg was administered intravenously at the order of the provider performing the procedure. Total intra-service moderate sedation time: 44 minutes. Patient's level of consciousness and vital signs were monitored continuously by radiology nurse throughout the procedure under the supervision of the provider performing the procedure. FLUOROSCOPY: Radiation Exposure Index (as provided by the fluoroscopic device): 65 mGy Kerma COMPLICATIONS: None immediate. PROCEDURE: The procedure was explained to the patient. The risks and benefits of the procedure were discussed and the patient's questions were addressed. Informed consent was obtained from the patient. Patient was placed on the interventional table. A 5 French nasogastric tube was easily advanced into the stomach using fluoroscopy. The anterior abdomen was prepped and draped in sterile fashion. Maximal barrier sterile technique was utilized including caps, mask, sterile gowns, sterile gloves, sterile drape, hand hygiene and skin antiseptic. This stomach was insufflated with gas. Patient has a large amount of bowel gas throughout the abdomen. Stomach has a horizontal orientation and majority the stomach is underneath the xiphoid process and anterior ribs. Skin was anesthetized with 1% lidocaine. Attempted to advance a needle into the stomach but needle placement would require a severe caudal to cranial angulation. Unfortunately, there were multiple gas-filled loops of bowel just underneath the stomach and lateral view raised concern that the needle may traverse a loop of bowel prior to entering the stomach. Therefore, cone beam CT was performed in order to better evaluate the anatomy. Additional gas was placed in the stomach but a safe  percutaneous window could not be identified for gastrostomy tube placement. Therefore, the gastrostomy tube procedure was not performed. FINDINGS: Stomach has a very horizontal orientation and despite insufflation with air, majority of the stomach is underneath the patient's anterior ribs and the xiphoid process. In addition, there is a large amount of gas-filled bowel underneath the stomach which limits our ability to advance the needle into the stomach from a caudal to cranial orientation. Safe percutaneous window was not identified for gastrostomy tube placement. IMPRESSION: 1. Percutaneous gastrostomy tube procedure was aborted due to difficult anatomy and cannot identify a safe percutaneous window for gastrostomy tube placement. Recommend surgical consultation for surgical gastrostomy or jejunal feeding tube. Electronically Signed   By: Markus Daft M.D.   On: 03/17/2021 17:27   IR Naso G Tube Plc W/FL W/Rad  Result Date: 03/17/2021 INDICATION: 45 year old  with squamous cell carcinoma of the tongue and needs a feeding tube for malnutrition. Percutaneous gastrostomy tube procedure was aborted due to difficult anatomy and could not identify a safe percutaneous window. EXAM: PLACEMENT OF NASOGASTRIC FEEDING TUBE WITH FLUOROSCOPY Physician: Shawn Rhodes. Shawn Pancoast, MD MEDICATIONS: None ANESTHESIA/SEDATION: None FLUOROSCOPY: Radiation Exposure Index (as provided by the fluoroscopic device): 92.1 mGy Kerma COMPLICATIONS: None immediate. PROCEDURE: Patient was placed on the interventional table. A Cortrak tube was easily advanced down the right nostril into the stomach with fluoroscopy. The tube was preferentially coiling in the gastric fundus. Eventually, the tube was directed toward the distal stomach and placed near the duodenal bulb. Contrast injection confirmed placement near the duodenal bulb. The tube was flushed with saline. Tube was secured to the patient's nose. FINDINGS: Feeding tube tip is near the duodenal bulb.  IMPRESSION: Successful placement of a nasogastric feeding tube with fluoroscopy. Feeding tube is ready to be used. Electronically Signed   By: Markus Daft M.D.   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 Malnutrition due to difficulty eating and drinking. EXAM: ATTEMPTED PERCUTANEOUS GASTROSTOMY TUBE PLACEMENT WITH FLUOROSCOPY AND CONE BEAM CT Physician: Shawn Rhodes. Shawn Pancoast, MD MEDICATIONS: Ancef 2 g; Antibiotics were administered within 1 hour of the procedure. Glucagon 0.5 mg ANESTHESIA/SEDATION: Moderate (conscious) sedation was employed during this procedure. A total of Versed 3.'0mg'$  and fentanyl 150 mcg was administered intravenously at the order of the provider performing the procedure. Total intra-service moderate sedation time: 44 minutes. Patient's level of consciousness and vital signs were monitored continuously by radiology nurse throughout the procedure under the supervision of the provider performing the procedure. FLUOROSCOPY: Radiation Exposure Index (as provided by the fluoroscopic device): 65 mGy Kerma COMPLICATIONS: None immediate. PROCEDURE: The procedure was explained to the patient. The risks and benefits of the procedure were discussed and the patient's questions were addressed. Informed consent was obtained from the patient. Patient was placed on the interventional table. A 5 French nasogastric tube was easily advanced into the stomach using fluoroscopy. The anterior abdomen was prepped and draped in sterile fashion. Maximal barrier sterile technique was utilized including caps, mask, sterile gowns, sterile gloves, sterile drape, hand hygiene and skin antiseptic. This stomach was insufflated with gas. Patient has a large amount of bowel gas throughout the abdomen. Stomach has a horizontal orientation and majority the stomach is underneath the xiphoid process and anterior ribs. Skin was anesthetized with 1%  lidocaine. Attempted to advance a needle into the stomach but needle placement would require a severe caudal to cranial angulation. Unfortunately, there were multiple gas-filled loops of bowel just underneath the stomach and lateral view raised concern that the needle may traverse a loop of bowel prior to entering the stomach. Therefore, cone beam CT was performed in order to better evaluate the anatomy. Additional gas was placed in the stomach but a safe percutaneous window could not be identified for gastrostomy tube placement. Therefore, the gastrostomy tube procedure was not performed. FINDINGS: Stomach has a very horizontal orientation and despite insufflation with air, majority of the stomach is underneath the patient's anterior ribs and the xiphoid process. In addition, there is a large amount of gas-filled bowel underneath the stomach which limits our ability to advance the needle into the stomach from a caudal to cranial orientation. Safe percutaneous window was not identified for gastrostomy tube placement. IMPRESSION: 1. Percutaneous gastrostomy tube procedure was aborted due to difficult  anatomy and cannot identify a safe percutaneous window for gastrostomy tube placement. Recommend surgical consultation for surgical gastrostomy or jejunal feeding tube. Electronically Signed   By: Markus Daft M.D.   On: 03/17/2021 17:27    Review of Systems  Constitutional: Negative.   HENT: Negative.    Eyes: Negative.   Respiratory: Negative.    Cardiovascular: Negative.   Gastrointestinal: Negative.   Genitourinary: Negative.   Musculoskeletal: Negative.   Skin: Negative.   Neurological: Negative.   Endo/Heme/Allergies: Negative.   Psychiatric/Behavioral: Negative.     PE Blood pressure 122/70, pulse (!) 59, temperature 98.2 F (36.8 C), temperature source Axillary, resp. rate 16, height '5\' 10"'$  (1.778 m), weight 65.7 kg, SpO2 92 %. Constitutional: NAD; answers mainly with grunts and nods; no  deformities Eyes: Moist conjunctiva; no lid lag; anicteric; PERRL Neck: Trachea midline; no thyromegaly Lungs: Normal respiratory effort; no tactile fremitus CV: RRR; no palpable thrills; no pitting edema GI: Abd soft, NT; no palpable hepatosplenomegaly MSK: Normal gait; no clubbing/cyanosis Psychiatric: Appropriate affect; alert and oriented x3 Lymphatic: No palpable cervical or axillary lymphadenopathy Skin: No major subcutaneous nodules. Warm and dry   Assessment/Plan: 46 yo male with recurrent SCC of tongue with worsening dysphagia and weight loss. He is unable to maintain nutritional needs without feeding tube access. We discussed the nature of his recurrent cancer and that feeding access alone would not treat the cancer and that if he was not to get treatment of the cancer, surgical access may not be in the best interest of the patient. He assured me he will proceed with cancer treatment and would like to proceed with surgical placement of feeding tube. I reviewed PET scan and surgical reports from Dr. Benjamine Mola. I think a laparoscopic placement of g tube in feasible in this patient and would be the best option to fulfill his nutritional needs over the treatment course and beyond if necessary. -We discussed the surgical technique as well as risks including bleeding, injury to abdominal organs, feeding tube dislodgement, scar tissue build up, dermatitis, drainage from g tube, and general care of the feeding tube. He and family showed good understanding and wanted to proceed.  I reviewed last 24 h vitals and pain scores, last 48 h intake and output, last 24 h labs and trends, and last 24 h imaging results.  This care required high  level of medical decision making.   Arta Bruce Zamiah Tollett 03/18/2021, 1:27 PM

## 2021-03-18 NOTE — Progress Notes (Signed)
Patient with home glucometer device attached to skin. He requests not to be stuck and use his device instead of accuchecks. Patient also gave himself home insulin supply before RN could give morning dose. Family made aware of hospital policy with home medications. Mother to take medication home and patient will receive hospital insulin moving forward.  ?

## 2021-03-18 NOTE — Care Management (Signed)
Acknowledge consult for home feeding via NGT. ?Patient cannot be accepted by home health agency for support of NGT feeds due to risk of tube migration and aspiration.  ?Patient will need permanent access such as a PEG tube for home tube feeds.  ?

## 2021-03-19 ENCOUNTER — Encounter (HOSPITAL_COMMUNITY)
Disposition: A | Discharge status: Home / Self Care | Payer: Self-pay | Source: Home / Self Care | Attending: Internal Medicine

## 2021-03-19 ENCOUNTER — Encounter (HOSPITAL_COMMUNITY): Payer: Self-pay | Admitting: Internal Medicine

## 2021-03-19 ENCOUNTER — Inpatient Hospital Stay (HOSPITAL_COMMUNITY): Payer: 59 | Admitting: Registered Nurse

## 2021-03-19 DIAGNOSIS — C029 Malignant neoplasm of tongue, unspecified: Secondary | ICD-10-CM

## 2021-03-19 DIAGNOSIS — E119 Type 2 diabetes mellitus without complications: Secondary | ICD-10-CM

## 2021-03-19 DIAGNOSIS — R627 Adult failure to thrive: Secondary | ICD-10-CM | POA: Diagnosis not present

## 2021-03-19 HISTORY — PX: LAPAROSCOPIC INSERTION GASTROSTOMY TUBE: SHX6817

## 2021-03-19 LAB — GLUCOSE, CAPILLARY
Glucose-Capillary: 182 mg/dL — ABNORMAL HIGH (ref 70–99)
Glucose-Capillary: 215 mg/dL — ABNORMAL HIGH (ref 70–99)
Glucose-Capillary: 282 mg/dL — ABNORMAL HIGH (ref 70–99)
Glucose-Capillary: 332 mg/dL — ABNORMAL HIGH (ref 70–99)
Glucose-Capillary: 354 mg/dL — ABNORMAL HIGH (ref 70–99)
Glucose-Capillary: 426 mg/dL — ABNORMAL HIGH (ref 70–99)

## 2021-03-19 LAB — BASIC METABOLIC PANEL
Anion gap: 7 (ref 5–15)
BUN: 20 mg/dL (ref 6–20)
CO2: 26 mmol/L (ref 22–32)
Calcium: 8.1 mg/dL — ABNORMAL LOW (ref 8.9–10.3)
Chloride: 107 mmol/L (ref 98–111)
Creatinine, Ser: 0.81 mg/dL (ref 0.61–1.24)
GFR, Estimated: >60 mL/min (ref >60)
Glucose, Bld: 221 mg/dL — ABNORMAL HIGH (ref 70–99)
Potassium: 3.9 mmol/L (ref 3.5–5.1)
Sodium: 140 mmol/L (ref 135–145)

## 2021-03-19 LAB — MAGNESIUM: Magnesium: 2.2 mg/dL (ref 1.7–2.4)

## 2021-03-19 LAB — PHOSPHORUS: Phosphorus: 3.1 mg/dL (ref 2.5–4.6)

## 2021-03-19 SURGERY — INSERTION, GASTROSTOMY TUBE, PERCUTANEOUS
Anesthesia: General

## 2021-03-19 MED ORDER — INSULIN ASPART 100 UNIT/ML IJ SOLN
8.0000 [IU] | Freq: Once | INTRAMUSCULAR | Status: AC
  Administered 2021-03-19: 8 [IU] via SUBCUTANEOUS

## 2021-03-19 MED ORDER — FENTANYL CITRATE (PF) 250 MCG/5ML IJ SOLN
INTRAMUSCULAR | Status: DC | PRN
  Administered 2021-03-19: 25 ug via INTRAVENOUS
  Administered 2021-03-19: 50 ug via INTRAVENOUS

## 2021-03-19 MED ORDER — LIDOCAINE 2% (20 MG/ML) 5 ML SYRINGE
INTRAMUSCULAR | Status: DC | PRN
  Administered 2021-03-19: 100 mg via INTRAVENOUS

## 2021-03-19 MED ORDER — PROPOFOL 10 MG/ML IV BOLUS
INTRAVENOUS | Status: AC
  Filled 2021-03-19: qty 20

## 2021-03-19 MED ORDER — ROCURONIUM BROMIDE 10 MG/ML (PF) SYRINGE
PREFILLED_SYRINGE | INTRAVENOUS | Status: DC | PRN
  Administered 2021-03-19: 25 mg via INTRAVENOUS
  Administered 2021-03-19: 15 mg via INTRAVENOUS

## 2021-03-19 MED ORDER — INSULIN ASPART 100 UNIT/ML IJ SOLN: 0.0000 [IU] | INTRAMUSCULAR | Status: DC | PRN

## 2021-03-19 MED ORDER — ORAL CARE MOUTH RINSE
15.0000 mL | Freq: Two times a day (BID) | OROMUCOSAL | Status: DC
  Administered 2021-03-20: 15 mL via OROMUCOSAL

## 2021-03-19 MED ORDER — LIP MEDEX EX OINT
1.0000 "application " | TOPICAL_OINTMENT | CUTANEOUS | Status: DC | PRN
  Filled 2021-03-19: qty 7

## 2021-03-19 MED ORDER — DEXMEDETOMIDINE (PRECEDEX) IN NS 20 MCG/5ML (4 MCG/ML) IV SYRINGE
PREFILLED_SYRINGE | INTRAVENOUS | Status: DC | PRN
  Administered 2021-03-19 (×2): 8 ug via INTRAVENOUS

## 2021-03-19 MED ORDER — DEXAMETHASONE SODIUM PHOSPHATE 10 MG/ML IJ SOLN
INTRAMUSCULAR | Status: AC
  Filled 2021-03-19: qty 1

## 2021-03-19 MED ORDER — ONDANSETRON HCL 4 MG/2ML IJ SOLN
INTRAMUSCULAR | Status: DC | PRN
  Administered 2021-03-19: 4 mg via INTRAVENOUS

## 2021-03-19 MED ORDER — ORAL CARE MOUTH RINSE: 15.0000 mL | Freq: Once | OROMUCOSAL | Status: AC

## 2021-03-19 MED ORDER — DEXAMETHASONE SODIUM PHOSPHATE 10 MG/ML IJ SOLN
INTRAMUSCULAR | Status: DC | PRN
  Administered 2021-03-19: 5 mg via INTRAVENOUS

## 2021-03-19 MED ORDER — ROCURONIUM BROMIDE 10 MG/ML (PF) SYRINGE
PREFILLED_SYRINGE | INTRAVENOUS | Status: AC
  Filled 2021-03-19: qty 10

## 2021-03-19 MED ORDER — BUPIVACAINE-EPINEPHRINE (PF) 0.25% -1:200000 IJ SOLN
INTRAMUSCULAR | Status: DC | PRN
  Administered 2021-03-19: 30 mL via PERINEURAL

## 2021-03-19 MED ORDER — LIDOCAINE 2% (20 MG/ML) 5 ML SYRINGE
INTRAMUSCULAR | Status: AC
  Filled 2021-03-19: qty 5

## 2021-03-19 MED ORDER — MIDAZOLAM HCL 2 MG/2ML IJ SOLN
INTRAMUSCULAR | Status: DC | PRN
Start: 2021-03-19 — End: 2021-03-19
  Administered 2021-03-19: 1 mg via INTRAVENOUS

## 2021-03-19 MED ORDER — OSMOLITE 1.2 CAL PO LIQD
1000.0000 mL | ORAL | Status: DC
  Administered 2021-03-19 – 2021-03-20 (×2): 1000 mL
  Filled 2021-03-19 (×3): qty 1000

## 2021-03-19 MED ORDER — PHENOL 1.4 % MT LIQD
1.0000 | OROMUCOSAL | Status: DC | PRN
  Administered 2021-03-19: 1 via OROMUCOSAL
  Filled 2021-03-19: qty 177

## 2021-03-19 MED ORDER — PROPOFOL 10 MG/ML IV BOLUS
INTRAVENOUS | Status: DC | PRN
  Administered 2021-03-19: 100 mg via INTRAVENOUS

## 2021-03-19 MED ORDER — FENTANYL CITRATE (PF) 100 MCG/2ML IJ SOLN: 25.0000 ug | INTRAMUSCULAR | Status: DC | PRN

## 2021-03-19 MED ORDER — LACTATED RINGERS IV SOLN: INTRAVENOUS | Status: DC

## 2021-03-19 MED ORDER — GUAIFENESIN-DM 100-10 MG/5ML PO SYRP
5.0000 mL | ORAL_SOLUTION | ORAL | Status: DC | PRN
  Administered 2021-03-19: 5 mL via ORAL
  Filled 2021-03-19: qty 5

## 2021-03-19 MED ORDER — SUCCINYLCHOLINE CHLORIDE 200 MG/10ML IV SOSY
PREFILLED_SYRINGE | INTRAVENOUS | Status: AC
  Filled 2021-03-19: qty 10

## 2021-03-19 MED ORDER — ONDANSETRON HCL 4 MG/2ML IJ SOLN
INTRAMUSCULAR | Status: AC
  Filled 2021-03-19: qty 2

## 2021-03-19 MED ORDER — SUCCINYLCHOLINE CHLORIDE 200 MG/10ML IV SOSY
PREFILLED_SYRINGE | INTRAVENOUS | Status: DC | PRN
  Administered 2021-03-19: 140 mg via INTRAVENOUS

## 2021-03-19 MED ORDER — 0.9 % SODIUM CHLORIDE (POUR BTL) OPTIME
TOPICAL | Status: DC | PRN
  Administered 2021-03-19: 1000 mL

## 2021-03-19 MED ORDER — FENTANYL CITRATE (PF) 250 MCG/5ML IJ SOLN
INTRAMUSCULAR | Status: AC
  Filled 2021-03-19: qty 5

## 2021-03-19 MED ORDER — PHENYLEPHRINE 40 MCG/ML (10ML) SYRINGE FOR IV PUSH (FOR BLOOD PRESSURE SUPPORT)
PREFILLED_SYRINGE | INTRAVENOUS | Status: DC | PRN
Start: 2021-03-19 — End: 2021-03-19
  Administered 2021-03-19: 80 ug via INTRAVENOUS

## 2021-03-19 MED ORDER — BUPIVACAINE-EPINEPHRINE (PF) 0.25% -1:200000 IJ SOLN
INTRAMUSCULAR | Status: AC
  Filled 2021-03-19: qty 30

## 2021-03-19 MED ORDER — MIDAZOLAM HCL 2 MG/2ML IJ SOLN
INTRAMUSCULAR | Status: AC
  Filled 2021-03-19: qty 2

## 2021-03-19 MED ORDER — SUGAMMADEX SODIUM 200 MG/2ML IV SOLN
INTRAVENOUS | Status: DC | PRN
  Administered 2021-03-19: 100 mg via INTRAVENOUS

## 2021-03-19 MED ORDER — CHLORHEXIDINE GLUCONATE 0.12 % MT SOLN
15.0000 mL | Freq: Once | OROMUCOSAL | Status: AC
  Administered 2021-03-19: 15 mL via OROMUCOSAL
  Filled 2021-03-19: qty 15

## 2021-03-19 MED ORDER — CHLORHEXIDINE GLUCONATE 0.12 % MT SOLN
15.0000 mL | Freq: Two times a day (BID) | OROMUCOSAL | Status: DC
  Administered 2021-03-19 – 2021-03-20 (×2): 15 mL via OROMUCOSAL
  Filled 2021-03-19 (×2): qty 15

## 2021-03-19 SURGICAL SUPPLY — 31 items
BAG COUNTER SPONGE SURGICOUNT (BAG) ×2 IMPLANT
BAG SURGICOUNT SPONGE COUNTING (BAG) ×1
CANISTER SUCT 3000ML PPV (MISCELLANEOUS) ×3 IMPLANT
CHLORAPREP W/TINT 26 (MISCELLANEOUS) ×3 IMPLANT
COVER SURGICAL LIGHT HANDLE (MISCELLANEOUS) ×3 IMPLANT
DERMABOND ADVANCED (GAUZE/BANDAGES/DRESSINGS) ×2
DERMABOND ADVANCED .7 DNX12 (GAUZE/BANDAGES/DRESSINGS) IMPLANT
ELECT REM PT RETURN 9FT ADLT (ELECTROSURGICAL) ×3 IMPLANT
ELECTRODE REM PT RTRN 9FT ADLT (ELECTROSURGICAL) ×1 IMPLANT
GAUZE SPONGE 4X4 12PLY STRL (GAUZE/BANDAGES/DRESSINGS) ×3 IMPLANT
GOWN STRL REUS W/ TWL LRG LVL3 (GOWN DISPOSABLE) ×1 IMPLANT
GOWN STRL REUS W/TWL LRG LVL3 (GOWN DISPOSABLE) ×2
GRASPER SUT TROCAR 14GX15 (MISCELLANEOUS) ×2 IMPLANT
HANDLE SUCTION POOLE (INSTRUMENTS) IMPLANT
KIT BASIN OR (CUSTOM PROCEDURE TRAY) ×3 IMPLANT
KIT TURNOVER KIT B (KITS) ×3 IMPLANT
NS IRRIG 1000ML POUR BTL (IV SOLUTION) ×2 IMPLANT
PACK GENERAL/GYN (CUSTOM PROCEDURE TRAY) ×3 IMPLANT
PAD ARMBOARD 7.5X6 YLW CONV (MISCELLANEOUS) ×6 IMPLANT
PENCIL SMOKE EVACUATOR (MISCELLANEOUS) ×3 IMPLANT
PLUG CATH AND CAP STER (CATHETERS) IMPLANT
SCISSORS LAP 5X35 DISP (ENDOMECHANICALS) ×2 IMPLANT
SLEEVE ENDOPATH XCEL 5M (ENDOMECHANICALS) ×2 IMPLANT
SUCTION POOLE HANDLE (INSTRUMENTS) IMPLANT
SUT MNCRL AB 4-0 PS2 18 (SUTURE) ×2 IMPLANT
SUT SILK 2 0 SH (SUTURE) ×4 IMPLANT
SUT SILK 2 0 SH CR/8 (SUTURE) ×3 IMPLANT
TOWEL GREEN STERILE (TOWEL DISPOSABLE) ×3 IMPLANT
TOWEL GREEN STERILE FF (TOWEL DISPOSABLE) ×3 IMPLANT
TROCAR XCEL NON-BLD 5MMX100MML (ENDOMECHANICALS) ×2 IMPLANT
TUBE GASTROSTOMY 18F (CATHETERS) ×2 IMPLANT

## 2021-03-19 NOTE — Anesthesia Postprocedure Evaluation (Signed)
Anesthesia Post Note ? ?Patient: Shawn Rhodes ? ?Procedure(s) Performed: LAPAROSCOPIC INSERTION GASTROSTOMY TUBE ? ?  ? ?Patient location during evaluation: PACU ?Anesthesia Type: General ?Level of consciousness: awake ?Pain management: pain level controlled ?Vital Signs Assessment: post-procedure vital signs reviewed and stable ?Cardiovascular status: stable ?Postop Assessment: no apparent nausea or vomiting ?Anesthetic complications: no ? ? ?No notable events documented. ? ?Last Vitals:  ?Vitals:  ? 03/19/21 1017 03/19/21 1234  ?BP: 116/67 120/72  ?Pulse: 60 (!) 57  ?Resp: 17 19  ?Temp:  36.5 ?C  ?SpO2:  95%  ?  ?Last Pain:  ?Vitals:  ? 03/19/21 1234  ?TempSrc: Oral  ?PainSc:   ? ? ?  ?  ?  ?  ?  ?  ? ?Oracio Galen ? ? ? ? ?

## 2021-03-19 NOTE — Progress Notes (Signed)
PROGRESS NOTE    Shawn Rhodes  UUV:[id number] DOB: September 02, 1975 DOA: 03/17/2021 PCP: Shirline Frees, MD     Brief Narrative:  Patient is a 46 y.o.  male with head and neck cancer (not yet established with oncology as patient and mother are still in the process of getting multiple opinions from different medical centers) who presented to IR for an elective outpatient PEG tube placement. He was experiencing increasing dysphagia, weight loss, minimal oral intake. Unfortunately IR was not able to place PEG tube so Cortak tube was placed.   Significant events: 3/10>> admit to hospitalist service-after IR unable to place PEG-has cortrak tube 3/12>> PEG placed in OR by Dr. Kieth Brightly   New events last 24 hours / Subjective: Patient underwent PEG placement in the OR this morning without acute complication.  On my examination, patient is feeling well overall, some soreness in his abdomen.  No nausea.  Assessment & Plan:   Principal Problem:   Failure to thrive in adult   Failure to thrive secondary to head and neck tumor -PEG tube placed 3/12 -Tube feeding to start and monitor for refeeding syndrome with K, Mg, Phos trend  -SLP eval  Squamous cell carcinoma of tongue -Patient and mom to follow-up with outpatient oncology  Diabetes mellitus type 2 -A1c pending -SSI    DVT prophylaxis:  enoxaparin (LOVENOX) injection 40 mg Start: 03/18/21 1145 SCDs Start: 03/17/21 1628  Code Status: Full Family Communication: Mother at bedside Disposition Plan:  Status is: Inpatient Remains inpatient appropriate because: OR today, started on tube feeds and monitor refeeding syndrome   Antimicrobials:  Anti-infectives (From admission, onward)    Start     Dose/Rate Route Frequency Ordered Stop   03/19/21 0600  ceFAZolin (ANCEF) IVPB 2g/100 mL premix        2 g 200 mL/hr over 30 Minutes Intravenous On call to O.R. 03/18/21 1343 03/19/21 0608        Objective: Vitals:   03/19/21 0926  03/19/21 0941 03/19/21 0955 03/19/21 1017  BP: (!) 124/56 (!) 111/54 123/71 116/67  Pulse: 64 63 (!) 58 60  Resp: '17 16 15 17  '$ Temp: 97.7 F (36.5 C)  98.4 F (36.9 C)   TempSrc:      SpO2: 95% 95% 94%   Weight:      Height:        Intake/Output Summary (Last 24 hours) at 03/19/2021 1149 Last data filed at 03/19/2021 0921 Gross per 24 hour  Intake 4634.68 ml  Output 25 ml  Net 4609.68 ml   Filed Weights   03/17/21 2100 03/19/21 0700  Weight: 65.7 kg 65.7 kg    Examination:  General exam: Appears calm and comfortable  Respiratory system: Clear to auscultation. Respiratory effort normal. No respiratory distress. No conversational dyspnea.  Cardiovascular system: S1 & S2 heard, RRR. No murmurs. No pedal edema. Gastrointestinal system: Abdomen is nondistended, soft  Central nervous system: Alert and oriented. No focal neurological deficits. Speech clear.  Extremities: Symmetric in appearance  Skin: No rashes, lesions or ulcers on exposed skin  Psychiatry: Judgement and insight appear normal. Mood & affect appropriate.   Data Reviewed: I have personally reviewed following labs and imaging studies  CBC: Recent Labs  Lab 03/17/21 0945  WBC 8.3  NEUTROABS 6.5  HGB 13.7  HCT 40.3  MCV 86.5  PLT 474   Basic Metabolic Panel: Recent Labs  Lab 03/18/21 0114 03/18/21 0650 03/18/21 2343  NA 142  --  140  K 3.9  --  3.9  CL 108  --  107  CO2 25  --  26  GLUCOSE 162*  --  221*  BUN 27*  --  20  CREATININE 1.03  --  0.81  CALCIUM 8.4*  --  8.1*  MG  --  1.9 2.2  PHOS  --  2.3* 3.1   GFR: Estimated Creatinine Clearance: 104.8 mL/min (by C-G formula based on SCr of 0.81 mg/dL). Liver Function Tests: Recent Labs  Lab 03/18/21 0650  AST 48*  ALT 85*  ALKPHOS 73  BILITOT 1.0  PROT 5.9*  ALBUMIN 2.9*   No results for input(s): LIPASE, AMYLASE in the last 168 hours. No results for input(s): AMMONIA in the last 168 hours. Coagulation Profile: Recent Labs  Lab  03/17/21 0945  INR 1.1   Cardiac Enzymes: No results for input(s): CKTOTAL, CKMB, CKMBINDEX, TROPONINI in the last 168 hours. BNP (last 3 results) No results for input(s): PROBNP in the last 8760 hours. HbA1C: No results for input(s): HGBA1C in the last 72 hours. CBG: Recent Labs  Lab 03/17/21 0914 03/19/21 0718 03/19/21 0930  GLUCAP 109* 426* 354*   Lipid Profile: No results for input(s): CHOL, HDL, LDLCALC, TRIG, CHOLHDL, LDLDIRECT in the last 72 hours. Thyroid Function Tests: No results for input(s): TSH, T4TOTAL, FREET4, T3FREE, THYROIDAB in the last 72 hours. Anemia Panel: No results for input(s): VITAMINB12, FOLATE, FERRITIN, TIBC, IRON, RETICCTPCT in the last 72 hours. Sepsis Labs: No results for input(s): PROCALCITON, LATICACIDVEN in the last 168 hours.  Recent Results (from the past 240 hour(s))  SARS Coronavirus 2 by RT PCR (hospital order, performed in [facility] hospital lab) Nasopharyngeal Nasopharyngeal Swab     Status: None   Collection Time: 03/18/21  4:11 PM   Specimen: Nasopharyngeal Swab  Result Value Ref Range Status   SARS Coronavirus 2 NEGATIVE NEGATIVE Final    Comment: (NOTE) SARS-CoV-2 target nucleic acids are NOT DETECTED.  The SARS-CoV-2 RNA is generally detectable in upper and lower respiratory specimens during the acute phase of infection. The lowest concentration of SARS-CoV-2 viral copies this assay can detect is 250 copies / mL. A negative result does not preclude SARS-CoV-2 infection and should not be used as the sole basis for treatment or other patient management decisions.  A negative result may occur with improper specimen collection / handling, submission of specimen other than nasopharyngeal swab, presence of viral mutation(s) within the areas targeted by this assay, and inadequate number of viral copies (<250 copies / mL). A negative result must be combined with clinical observations, patient history, and epidemiological  information.  Fact Sheet for Patients:   StrictlyIdeas.no  Fact Sheet for Healthcare Providers: BankingDealers.co.za  This test is not yet approved or  cleared by the [place] FDA and has been authorized for detection and/or diagnosis of SARS-CoV-2 by FDA under an Emergency Use Authorization (EUA).  This EUA will remain in effect (meaning this test can be used) for the duration of the COVID-19 declaration under Section 564(b)(1) of the Act, 21 U.S.C. section 360bbb-3(b)(1), unless the authorization is terminated or revoked sooner.  Performed at [facility], [place] [address] [place], [place] [address]       Radiology Studies: IR GASTROSTOMY TUBE MOD SED  Result Date: 03/17/2021 INDICATION: 46 year old with squamous cell carcinoma of the tongue. Malnutrition due to difficulty eating and drinking. EXAM: ATTEMPTED PERCUTANEOUS GASTROSTOMY TUBE PLACEMENT WITH FLUOROSCOPY AND CONE BEAM CT Physician: Stephan Minister. Anselm Pancoast, MD MEDICATIONS: Ancef 2 g; Antibiotics  were administered within 1 hour of the procedure. Glucagon 0.5 mg ANESTHESIA/SEDATION: Moderate (conscious) sedation was employed during this procedure. A total of Versed 3.'0mg'$  and fentanyl 150 mcg was administered intravenously at the order of the provider performing the procedure. Total intra-service moderate sedation time: 44 minutes. Patient's level of consciousness and vital signs were monitored continuously by radiology nurse throughout the procedure under the supervision of the provider performing the procedure. FLUOROSCOPY: Radiation Exposure Index (as provided by the fluoroscopic device): 65 mGy Kerma COMPLICATIONS: None immediate. PROCEDURE: The procedure was explained to the patient. The risks and benefits of the procedure were discussed and the patient's questions were addressed. Informed consent was obtained from the patient. Patient was placed on the interventional table. A 5  French nasogastric tube was easily advanced into the stomach using fluoroscopy. The anterior abdomen was prepped and draped in sterile fashion. Maximal barrier sterile technique was utilized including caps, mask, sterile gowns, sterile gloves, sterile drape, hand hygiene and skin antiseptic. This stomach was insufflated with gas. Patient has a large amount of bowel gas throughout the abdomen. Stomach has a horizontal orientation and majority the stomach is underneath the xiphoid process and anterior ribs. Skin was anesthetized with 1% lidocaine. Attempted to advance a needle into the stomach but needle placement would require a severe caudal to cranial angulation. Unfortunately, there were multiple gas-filled loops of bowel just underneath the stomach and lateral view raised concern that the needle may traverse a loop of bowel prior to entering the stomach. Therefore, cone beam CT was performed in order to better evaluate the anatomy. Additional gas was placed in the stomach but a safe percutaneous window could not be identified for gastrostomy tube placement. Therefore, the gastrostomy tube procedure was not performed. FINDINGS: Stomach has a very horizontal orientation and despite insufflation with air, majority of the stomach is underneath the patient's anterior ribs and the xiphoid process. In addition, there is a large amount of gas-filled bowel underneath the stomach which limits our ability to advance the needle into the stomach from a caudal to cranial orientation. Safe percutaneous window was not identified for gastrostomy tube placement. IMPRESSION: 1. Percutaneous gastrostomy tube procedure was aborted due to difficult anatomy and cannot identify a safe percutaneous window for gastrostomy tube placement. Recommend surgical consultation for surgical gastrostomy or jejunal feeding tube. Electronically Signed   By: Markus Daft M.D.   On: 03/17/2021 17:27   IR Naso G Tube Plc W/FL W/Rad  Result Date:  03/17/2021 INDICATION: 46 year old with squamous cell carcinoma of the tongue and needs a feeding tube for malnutrition. Percutaneous gastrostomy tube procedure was aborted due to difficult anatomy and could not identify a safe percutaneous window. EXAM: PLACEMENT OF NASOGASTRIC FEEDING TUBE WITH FLUOROSCOPY Physician: Stephan Minister. Anselm Pancoast, MD MEDICATIONS: None ANESTHESIA/SEDATION: None FLUOROSCOPY: Radiation Exposure Index (as provided by the fluoroscopic device): 71.6 mGy Kerma COMPLICATIONS: None immediate. PROCEDURE: Patient was placed on the interventional table. A Cortrak tube was easily advanced down the right nostril into the stomach with fluoroscopy. The tube was preferentially coiling in the gastric fundus. Eventually, the tube was directed toward the distal stomach and placed near the duodenal bulb. Contrast injection confirmed placement near the duodenal bulb. The tube was flushed with saline. Tube was secured to the patient's nose. FINDINGS: Feeding tube tip is near the duodenal bulb. IMPRESSION: Successful placement of a nasogastric feeding tube with fluoroscopy. Feeding tube is ready to be used. Electronically Signed   By: Scherrie Gerlach.D.  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 Malnutrition due to difficulty eating and drinking. EXAM: ATTEMPTED PERCUTANEOUS GASTROSTOMY TUBE PLACEMENT WITH FLUOROSCOPY AND CONE BEAM CT Physician: Stephan Minister. Anselm Pancoast, MD MEDICATIONS: Ancef 2 g; Antibiotics were administered within 1 hour of the procedure. Glucagon 0.5 mg ANESTHESIA/SEDATION: Moderate (conscious) sedation was employed during this procedure. A total of Versed 3.'0mg'$  and fentanyl 150 mcg was administered intravenously at the order of the provider performing the procedure. Total intra-service moderate sedation time: 44 minutes. Patient's level of consciousness and vital signs were monitored continuously by radiology nurse  throughout the procedure under the supervision of the provider performing the procedure. FLUOROSCOPY: Radiation Exposure Index (as provided by the fluoroscopic device): 65 mGy Kerma COMPLICATIONS: None immediate. PROCEDURE: The procedure was explained to the patient. The risks and benefits of the procedure were discussed and the patient's questions were addressed. Informed consent was obtained from the patient. Patient was placed on the interventional table. A 5 French nasogastric tube was easily advanced into the stomach using fluoroscopy. The anterior abdomen was prepped and draped in sterile fashion. Maximal barrier sterile technique was utilized including caps, mask, sterile gowns, sterile gloves, sterile drape, hand hygiene and skin antiseptic. This stomach was insufflated with gas. Patient has a large amount of bowel gas throughout the abdomen. Stomach has a horizontal orientation and majority the stomach is underneath the xiphoid process and anterior ribs. Skin was anesthetized with 1% lidocaine. Attempted to advance a needle into the stomach but needle placement would require a severe caudal to cranial angulation. Unfortunately, there were multiple gas-filled loops of bowel just underneath the stomach and lateral view raised concern that the needle may traverse a loop of bowel prior to entering the stomach. Therefore, cone beam CT was performed in order to better evaluate the anatomy. Additional gas was placed in the stomach but a safe percutaneous window could not be identified for gastrostomy tube placement. Therefore, the gastrostomy tube procedure was not performed. FINDINGS: Stomach has a very horizontal orientation and despite insufflation with air, majority of the stomach is underneath the patient's anterior ribs and the xiphoid process. In addition, there is a large amount of gas-filled bowel underneath the stomach which limits our ability to advance the needle into the stomach from a caudal to  cranial orientation. Safe percutaneous window was not identified for gastrostomy tube placement. IMPRESSION: 1. Percutaneous gastrostomy tube procedure was aborted due to difficult anatomy and cannot identify a safe percutaneous window for gastrostomy tube placement. Recommend surgical consultation for surgical gastrostomy or jejunal feeding tube. Electronically Signed   By: Markus Daft M.D.   On: 03/17/2021 17:27      Scheduled Meds:  enoxaparin (LOVENOX) injection  40 mg Subcutaneous Q24H   feeding supplement (PROSource TF)  45 mL Per Tube Daily   free water  100 mL Per Tube Q4H   gabapentin  300 mg Per Tube Q8H   insulin aspart  0-9 Units Subcutaneous Q4H   Continuous Infusions:  feeding supplement (OSMOLITE 1.2 CAL) 1,000 mL (03/19/21 1057)     LOS: 2 days     Dessa Phi, DO [facility] 03/19/2021, 11:49 AM   Available via Epic secure chat 7am-7pm After these hours, please refer to coverage provider listed on [email or web address]

## 2021-03-19 NOTE — Plan of Care (Signed)

## 2021-03-19 NOTE — Anesthesia Procedure Notes (Signed)
Procedure Name: Intubation ?Date/Time: 03/19/2021 8:17 AM ?Performed by: Imagene Riches, CRNA ?Pre-anesthesia Checklist: Patient identified, Emergency Drugs available, Suction available and Patient being monitored ?Patient Re-evaluated:Patient Re-evaluated prior to induction ?Oxygen Delivery Method: Circle System Utilized ?Preoxygenation: Pre-oxygenation with 100% oxygen ?Induction Type: IV induction ?Ventilation: Mask ventilation without difficulty ?Laryngoscope Size: Glidescope and 3 ?Grade View: Grade II ?Tube type: Oral ?Tube size: 7.5 mm ?Number of attempts: 1 ?Airway Equipment and Method: Stylet and Oral airway ?Placement Confirmation: ETT inserted through vocal cords under direct vision, positive ETCO2 and breath sounds checked- equal and bilateral ?Secured at: 28 cm ?Tube secured with: Tape ?Dental Injury: Teeth and Oropharynx as per pre-operative assessment  ?Difficulty Due To: Difficulty was anticipated, Difficult Airway- due to limited oral opening, Difficult Airway- due to anterior larynx and Difficult Airway- due to immobile epiglottis ?Future Recommendations: Recommend- induction with short-acting agent, and alternative techniques readily available ?Comments: Elective glide scope intubation second to surgical history. Easy mask ventilation without oral airway.  ? ? ? ? ?

## 2021-03-19 NOTE — Anesthesia Preprocedure Evaluation (Signed)
Anesthesia Evaluation  ?Patient identified by MRN, date of birth, ID band ?Patient awake ? ?General Assessment Comment:Case discussed with Dr. Kieth Brightly ?Dr. Nyoka Cowden ? ?Reviewed: ?Allergy & Precautions, NPO status , Patient's Chart, lab work & pertinent test results ? ?Airway ?Mallampati: II ? ?TM Distance: >3 FB ? ? ? ? Dental ?  ?Pulmonary ? ?  ?breath sounds clear to auscultation ? ? ? ? ? ? Cardiovascular ? ?Rhythm:Regular Rate:Normal ? ? ?  ?Neuro/Psych ?  ? GI/Hepatic ?Neg liver ROS, History noted ?Dr. Nyoka Cowden ?  ?Endo/Other  ?diabetes ? Renal/GU ?negative Renal ROS  ? ?  ?Musculoskeletal ? ? Abdominal ?  ?Peds ? Hematology ?  ?Anesthesia Other Findings ? ? Reproductive/Obstetrics ? ?  ? ? ? ? ? ? ? ? ? ? ? ? ? ?  ?  ? ? ? ? ? ? ? ? ?Anesthesia Physical ?Anesthesia Plan ? ?ASA: 3 ? ?Anesthesia Plan: General  ? ?Post-op Pain Management:   ? ?Induction: Intravenous ? ?PONV Risk Score and Plan: 3 and Ondansetron, Midazolam and Treatment may vary due to age or medical condition ? ?Airway Management Planned: Oral ETT ? ?Additional Equipment:  ? ?Intra-op Plan:  ? ?Post-operative Plan: Possible Post-op intubation/ventilation ? ?Informed Consent: I have reviewed the patients History and Physical, chart, labs and discussed the procedure including the risks, benefits and alternatives for the proposed anesthesia with the patient or authorized representative who has indicated his/her understanding and acceptance.  ? ? ? ?Dental advisory given ? ?Plan Discussed with: CRNA and Anesthesiologist ? ?Anesthesia Plan Comments:   ? ? ? ? ? ? ?Anesthesia Quick Evaluation ? ?

## 2021-03-19 NOTE — Progress Notes (Signed)
Arrived back from PACU, bedside handoff completed with Annabell Sabal. Patient placed on bedside cardiac monitor, vital signs obtained. Patient and family (mother at bedside) updated on plan of care. ?

## 2021-03-19 NOTE — Op Note (Signed)
Preoperative diagnosis: SCC of the tongue ? ?Postoperative diagnosis: same  ? ?Procedure: laparoscopic gastrostomy tube insertion ? ?Surgeon: Gurney Maxin, M.D. ? ?Asst: none ? ?Anesthesia: general ? ?Indications for procedure: Shawn Rhodes is a 46 y.o. year old male with symptoms of increasing dysphagia. ? ?Description of procedure: The patient was brought into the operative suite. Anesthesia was administered with General endotracheal anesthesia. WHO checklist was applied. The patient was then placed in supine position. The area was prepped and draped in the usual sterile fashion. ? ?Next, a transverse incision was in the right subcostal area. A 10m trocar was used to gain access to the peritoneal cavity by optical entry technique. Pneumoperitoneum was applied with high flow and low pressure and laparoscope was reinserted to confirm placement. ? ?1 12 mm trocar was placed in the right mid abdomen and 1 5 mm trocar was placed in the left mid abdomen. ? ?Next, the distal stomach body was identified and appeared to reach the left upper quadrant abdominal wall. A pursestring with 2-0 silk was made in the distal body. A gastrotomy was performed in the center A gastrostomy was created with cautery. A 18 french gastrostomy tube was inserted and balloon filled with 8 ml of saline. 2 2-0 silks were passed through the distal body of the stomach and sutured to the abdominal wall using a suture passer. The gastrostomy balloon was pulled up to the abdominal wall and was noted at 3 cm at the skin level. Pneumoperitoneum was removed. Trocars were removed. All trocars sites were closed with 4-0 monocryl subcuticular sutures. The patient tolerated the procedure well. All counts were correct. ? ?Findings: intact gastrostomy ? ?Specimen: none ? ?Implant: 18 fr g tube  ? ?Blood loss: 10 ml ? ?Local anesthesia:  30 ml marcaine  ? ?Complications: none ? ?LGurney Maxin M.D. ?General, Bariatric, & Minimally Invasive Surgery ?[facility] [place]? ?

## 2021-03-19 NOTE — Progress Notes (Signed)
Small bore feeding tube removed at this time per Dr. Jeannine Kitten order.  ?

## 2021-03-19 NOTE — Progress Notes (Signed)
Pre Procedure note for inpatients: ?  ?Shawn Rhodes has been scheduled for Procedure(s): ?LAPAROSCOPIC INSERTION GASTROSTOMY TUBE (N/A) today. The various methods of treatment have been discussed with the patient. After consideration of the risks, benefits and treatment options the patient has consented to the planned procedure.  ? ?The patient has been seen and labs reviewed. There are no changes in the patient?s condition to prevent proceeding with the planned procedure today. ? ?Recent labs: ? ?Lab Results  ?Component Value Date  ? WBC 8.3 03/17/2021  ? HGB 13.7 03/17/2021  ? HCT 40.3 03/17/2021  ? PLT 296 03/17/2021  ? GLUCOSE 221 (H) 03/18/2021  ? ALT 85 (H) 03/18/2021  ? AST 48 (H) 03/18/2021  ? NA 140 03/18/2021  ? K 3.9 03/18/2021  ? CL 107 03/18/2021  ? CREATININE 0.81 03/18/2021  ? BUN 20 03/18/2021  ? CO2 26 03/18/2021  ? INR 1.1 03/17/2021  ? HGBA1C 7.2 (H) 07/28/2020  ? ? ?[name], MD ?03/19/2021 7:35 AM ? ? ?  ? ?

## 2021-03-19 NOTE — Transfer of Care (Signed)
Immediate Anesthesia Transfer of Care Note ? ?Patient: Shawn Rhodes ? ?Procedure(s) Performed: LAPAROSCOPIC INSERTION GASTROSTOMY TUBE ? ?Patient Location: PACU ? ?Anesthesia Type:General ? ?Level of Consciousness: drowsy ? ?Airway & Oxygen Therapy: Patient Spontanous Breathing and Patient connected to nasal cannula oxygen ? ?Post-op Assessment: Report given to RN and Post -op Vital signs reviewed and stable ? ?Post vital signs: Reviewed and stable ? ?Last Vitals:  ?Vitals Value Taken Time  ?BP 111/54 03/19/21 0941  ?Temp 36.5 ?C 03/19/21 0926  ?Pulse 61 03/19/21 0943  ?Resp 18 03/19/21 0943  ?SpO2 91 % 03/19/21 0943  ?Vitals shown include unvalidated device data. ? ?Last Pain:  ?Vitals:  ? 03/19/21 0926  ?TempSrc:   ?PainSc: 0-No pain  ?   ? ?  ? ?Complications: No notable events documented. ?

## 2021-03-20 ENCOUNTER — Inpatient Hospital Stay (HOSPITAL_COMMUNITY): Payer: 59

## 2021-03-20 ENCOUNTER — Encounter (HOSPITAL_COMMUNITY): Payer: Self-pay | Admitting: General Surgery

## 2021-03-20 DIAGNOSIS — R627 Adult failure to thrive: Secondary | ICD-10-CM | POA: Diagnosis not present

## 2021-03-20 LAB — COMPREHENSIVE METABOLIC PANEL
ALT: 45 U/L — ABNORMAL HIGH (ref 0–44)
AST: 27 U/L (ref 15–41)
Albumin: 2.6 g/dL — ABNORMAL LOW (ref 3.5–5.0)
Alkaline Phosphatase: 70 U/L (ref 38–126)
Anion gap: 6 (ref 5–15)
BUN: 18 mg/dL (ref 6–20)
CO2: 28 mmol/L (ref 22–32)
Calcium: 8.4 mg/dL — ABNORMAL LOW (ref 8.9–10.3)
Chloride: 106 mmol/L (ref 98–111)
Creatinine, Ser: 0.85 mg/dL (ref 0.61–1.24)
GFR, Estimated: >60 mL/min (ref >60)
Glucose, Bld: 313 mg/dL — ABNORMAL HIGH (ref 70–99)
Potassium: 3.9 mmol/L (ref 3.5–5.1)
Sodium: 140 mmol/L (ref 135–145)
Total Bilirubin: 0.7 mg/dL (ref 0.3–1.2)
Total Protein: 5.3 g/dL — ABNORMAL LOW (ref 6.5–8.1)

## 2021-03-20 LAB — GLUCOSE, CAPILLARY
Glucose-Capillary: 284 mg/dL — ABNORMAL HIGH (ref 70–99)
Glucose-Capillary: 322 mg/dL — ABNORMAL HIGH (ref 70–99)
Glucose-Capillary: 239 mg/dL — ABNORMAL HIGH (ref 70–99)

## 2021-03-20 LAB — PHOSPHORUS: Phosphorus: 1.9 mg/dL — ABNORMAL LOW (ref 2.5–4.6)

## 2021-03-20 LAB — MAGNESIUM: Magnesium: 1.9 mg/dL (ref 1.7–2.4)

## 2021-03-20 LAB — HEMOGLOBIN A1C
Hgb A1c MFr Bld: 5.2 % (ref 4.8–5.6)
Hgb A1c MFr Bld: 5.3 % (ref 4.8–5.6)
Mean Plasma Glucose: 103 mg/dL
Mean Plasma Glucose: 105 mg/dL

## 2021-03-20 MED ORDER — PROSOURCE TF PO LIQD: 45.0000 mL | Freq: Every day | ORAL | Status: DC

## 2021-03-20 MED ORDER — OSMOLITE 1.5 CAL PO LIQD
355.0000 mL | Freq: Four times a day (QID) | ORAL | Status: DC
  Filled 2021-03-20 (×3): qty 474

## 2021-03-20 MED ORDER — OSMOLITE 1.5 CAL PO LIQD: 355.0000 mL | Freq: Four times a day (QID) | ORAL | 0 refills | Status: DC

## 2021-03-20 MED ORDER — INSULIN GLARGINE-YFGN 100 UNIT/ML ~~LOC~~ SOLN
10.0000 [IU] | Freq: Every day | SUBCUTANEOUS | Status: DC
  Administered 2021-03-20: 10 [IU] via SUBCUTANEOUS
  Filled 2021-03-20: qty 0.1

## 2021-03-20 NOTE — Evaluation (Signed)
Physical Therapy Evaluation ?Patient Details ?Name: Shawn Rhodes ?MRN: [id number] ?DOB: June 10, 1975 ?Today's Date: 03/20/2021 ? ?History of Present Illness ? Pt is a 46 y/o male admitted for peg tube placement secondary to head and neck cancer. Attempted G tube placement on 3/10, however, was unsuccessful and had to place cortrak. Underwent succesful G tube placement on 3/12. PMH includes DM and head and neck cancer.  ?Clinical Impression ? Patient evaluated by Physical Therapy with no further acute PT needs identified. All education has been completed and the patient has no further questions. Pt overall steady and independent with transfers and gait. Mild unsteadiness noted initially with stair navigation and required min guard A, however, improved with practice and progressed to supervision level. Pt's mother able to assist as needed. Educated about walking program to perform at home. See below for any follow-up Physical Therapy or equipment needs. PT is signing off. Thank you for this referral. If needs change, please re-consult.  ?   ?   ? ?Recommendations for follow up therapy are one component of a multi-disciplinary discharge planning process, led by the attending physician.  Recommendations may be updated based on patient status, additional functional criteria and insurance authorization. ? ?Follow Up Recommendations No PT follow up ? ?  ?Assistance Recommended at Discharge Intermittent Supervision/Assistance  ?Patient can return home with the following ? Assistance with feeding (tube feeds initially) ? ?  ?Equipment Recommendations None recommended by PT  ?Recommendations for Other Services ?    ?  ?Functional Status Assessment Patient has had a recent decline in their functional status and demonstrates the ability to make significant improvements in function in a reasonable and predictable amount of time.  ? ?  ?Precautions / Restrictions Precautions ?Precautions: None ?Restrictions ?Weight Bearing  Restrictions: No  ? ?  ? ?Mobility ? Bed Mobility ?Overal bed mobility: Independent ?  ?  ?  ?  ?  ?  ?  ?  ? ?Transfers ?Overall transfer level: Independent ?  ?  ?  ?  ?  ?  ?  ?  ?  ?  ? ?Ambulation/Gait ?Ambulation/Gait assistance: Independent ?Gait Distance (Feet): 120 Feet ?Assistive device: None ?Gait Pattern/deviations: WFL(Within Functional Limits) ?Gait velocity: Decreased ?  ?  ?General Gait Details: Overall steady gait. No LOB noted. Educated about walking program to perform at home ? ?Stairs ?Stairs: Yes ?Stairs assistance: Min guard, Supervision ?Stair Management: No rails, Step to pattern, Forwards ?Number of Stairs: 3 ?General stair comments: On first step, pt with mild unsteadiness and required min guard A for steadying. Increased steadiness with repeated attempts and only required supervision. ? ?Wheelchair Mobility ?  ? ?Modified Rankin (Stroke Patients Only) ?  ? ?  ? ?Balance Overall balance assessment: Mild deficits observed, not formally tested ?  ?  ?  ?  ?  ?  ?  ?  ?  ?  ?  ?  ?  ?  ?  ?  ?  ?  ?   ? ? ? ?Pertinent Vitals/Pain Pain Assessment ?Pain Assessment: No/denies pain  ? ? ?Home Living Family/patient expects to be discharged to:: Private residence ?Living Arrangements: Parent ?Available Help at Discharge: Family ?Type of Home: House ?Home Access: Stairs to enter ?Entrance Stairs-Rails: None ?Entrance Stairs-Number of Steps: 5 ?  ?Home Layout: One level ?Home Equipment: None ?   ?  ?Prior Function Prior Level of Function : Independent/Modified Independent ?  ?  ?  ?  ?  ?  ?  ?  ?  ? ? ?  Hand Dominance  ? Dominant Hand: Right ? ?  ?Extremity/Trunk Assessment  ? Upper Extremity Assessment ?Upper Extremity Assessment: Overall WFL for tasks assessed ?  ? ?Lower Extremity Assessment ?Lower Extremity Assessment: Overall WFL for tasks assessed ?  ? ?Cervical / Trunk Assessment ?Cervical / Trunk Assessment: Normal  ?Communication  ? Communication: Expressive difficulties (mildly slurred  speech)  ?Cognition Arousal/Alertness: Awake/alert ?Behavior During Therapy: [facility] for tasks assessed/performed ?Overall Cognitive Status: Within Functional Limits for tasks assessed ?  ?  ?  ?  ?  ?  ?  ?  ?  ?  ?  ?  ?  ?  ?  ?  ?  ?  ?  ? ?  ?General Comments General comments (skin integrity, edema, etc.): Pt's mom present during session ? ?  ?Exercises    ? ?Assessment/Plan  ?  ?PT Assessment Patient does not need any further PT services  ?PT Problem List   ? ?   ?  ?PT Treatment Interventions     ? ?PT Goals (Current goals can be found in the Care Plan section)  ?Acute Rehab PT Goals ?Patient Stated Goal: to go home ?PT Goal Formulation: With patient ?Time For Goal Achievement: 03/20/21 ?Potential to Achieve Goals: Good ? ?  ?Frequency   ?  ? ? ?Co-evaluation   ?  ?  ?  ?  ? ? ?  ?AM-PAC PT "6 Clicks" Mobility  ?Outcome Measure Help needed turning from your back to your side while in a flat bed without using bedrails?: None ?Help needed moving from lying on your back to sitting on the side of a flat bed without using bedrails?: None ?Help needed moving to and from a bed to a chair (including a wheelchair)?: None ?Help needed standing up from a chair using your arms (e.g., wheelchair or bedside chair)?: None ?Help needed to walk in hospital room?: None ?Help needed climbing 3-5 steps with a railing? : A Little ?6 Click Score: 23 ? ?  ?End of Session Equipment Utilized During Treatment: Gait belt ?Activity Tolerance: Patient tolerated treatment well ?Patient left: in bed;with call bell/phone within reach;with nursing/sitter in room;with family/visitor present ?Nurse Communication: Mobility status ?PT Visit Diagnosis: Other abnormalities of gait and mobility (R26.89) ?  ? ?Time: 6256-3893 ?PT Time Calculation (min) (ACUTE ONLY): 11 min ? ? ?Charges:   PT Evaluation ?$PT Eval Low Complexity: 1 Low ?  ?  ?   ? ? ?Reuel Derby, PT, DPT  ?Acute Rehabilitation Services  ?Pager: [phone] ?Office: [phone] ? ? ?[place] ?03/20/2021, 12:54 PM ? ?

## 2021-03-20 NOTE — Discharge Summary (Signed)
Physician Discharge Summary  Shawn Rhodes VHQ:[id number] DOB: 1975-11-29 DOA: 03/17/2021  PCP: Shirline Frees, MD  Admit date: 03/17/2021 Discharge date: 03/20/2021  Admitted From: Home Disposition:  Home  Recommendations for Outpatient Follow-up:  Follow up with PCP in 1 week Follow up with general surgery 4/5  Follow up with oncology, mom states they will follow up with oncology team in [place]  Discharge Condition: Stable CODE STATUS: Full  Diet recommendation: Ice chips, small sips of thin liquids, small bites of puree. Primary nutrition through PEG. Follow up with outpatient SLP  Brief/Interim Summary: Patient is a 46 y.o.  male with head and neck cancer (not yet established with oncology as patient and mother are still in the process of getting multiple opinions from different medical centers) who presented to IR for an elective outpatient PEG tube placement. He was experiencing increasing dysphagia, weight loss, minimal oral intake. Unfortunately IR was not able to place PEG tube so Cortak tube was placed.    Significant events: 3/10>> admit to hospitalist service-after IR unable to place PEG-has cortrak tube. Tube feeding started  3/12>> PEG placed in OR by Dr. Kieth Brightly     Discharge Diagnoses:  Principal Problem:   Failure to thrive in adult  Failure to thrive secondary to head and neck tumor -Tube feeding started 3/10 via cortrak. PEG tube placed 3/12 -SLP eval, recommend outpatient SLP follow up -Tube feeding per dietitian    Squamous cell carcinoma of tongue -Patient and mom to follow-up with outpatient oncology  Diabetes mellitus type 2, well controlled  -A1c 5.3  -Lantus, novolog PTA   Discharge Instructions  Discharge Instructions     Call MD for:  difficulty breathing, headache or visual disturbances   Complete by: As directed    Call MD for:  extreme fatigue   Complete by: As directed    Call MD for:  persistant dizziness or light-headedness    Complete by: As directed    Call MD for:  persistant nausea and vomiting   Complete by: As directed    Call MD for:  redness, tenderness, or signs of infection (pain, swelling, redness, odor or green/yellow discharge around incision site)   Complete by: As directed    Call MD for:  severe uncontrolled pain   Complete by: As directed    Call MD for:  temperature >100.4   Complete by: As directed    Discharge instructions   Complete by: As directed    You were cared for by a hospitalist during your hospital stay. If you have any questions about your discharge medications or the care you received while you were in the hospital after you are discharged, you can call the unit and ask to speak with the hospitalist on call if the hospitalist that took care of you is not available. Once you are discharged, your primary care physician will handle any further medical issues. Please note that NO REFILLS for any discharge medications will be authorized once you are discharged, as it is imperative that you return to your primary care physician (or establish a relationship with a primary care physician if you do not have one) for your aftercare needs so that they can reassess your need for medications and monitor your lab values.   Increase activity slowly   Complete by: As directed    No wound care   Complete by: As directed       Allergies as of 03/20/2021   No Known Allergies  Medication List     STOP taking these medications    lisinopril 5 MG tablet Commonly known as: ZESTRIL       TAKE these medications    feeding supplement (OSMOLITE 1.5 CAL) Liqd Place 355 mLs into feeding tube 4 (four) times daily.   feeding supplement (PROSource TF) liquid Place 45 mLs into feeding tube daily. Start taking on: March 21, 2021   gabapentin 300 MG capsule Commonly known as: NEURONTIN Take 300 mg by mouth 3 (three) times daily.   Lantus SoloStar 100 UNIT/ML Solostar Pen Generic drug:  insulin glargine Inject 8-10 Units into the skin 2 (two) times daily.   NovoLOG FlexPen 100 UNIT/ML FlexPen Generic drug: insulin aspart Inject 1-15 Units into the skin 3 (three) times daily with meals.   oxyCODONE 5 MG immediate release tablet Commonly known as: Oxy IR/ROXICODONE Take 5-10 mg by mouth every 4 (four) hours as needed.        Follow-up Information     Kinsinger, Arta Bruce, MD. Go on 04/12/2021.   Specialty: General Surgery Why: Your appointment is 4/5 at 9:50am. Please arrive 30 minutes prior to your appointment to check in and fill out paperwork. Bring photo ID and insurance information. Contact information: [address] [place] [address] [phone]         Shirline Frees, MD. Schedule an appointment as soon as possible for a visit in 1 week(s).   Specialty: Family Medicine Why: Hospital follow up Contact information: [place] [address] [phone]                No Known Allergies    Procedures/Studies: IR GASTROSTOMY TUBE MOD SED  Result Date: 03/17/2021 INDICATION: 46 year old with squamous cell carcinoma of the tongue. Malnutrition due to difficulty eating and drinking. EXAM: ATTEMPTED PERCUTANEOUS GASTROSTOMY TUBE PLACEMENT WITH FLUOROSCOPY AND CONE BEAM CT Physician: Stephan Minister. Anselm Pancoast, MD MEDICATIONS: Ancef 2 g; Antibiotics were administered within 1 hour of the procedure. Glucagon 0.5 mg ANESTHESIA/SEDATION: Moderate (conscious) sedation was employed during this procedure. A total of Versed 3.'0mg'$  and fentanyl 150 mcg was administered intravenously at the order of the provider performing the procedure. Total intra-service moderate sedation time: 44 minutes. Patient's level of consciousness and vital signs were monitored continuously by radiology nurse throughout the procedure under the supervision of the provider performing the procedure. FLUOROSCOPY: Radiation Exposure Index (as provided by the  fluoroscopic device): 65 mGy Kerma COMPLICATIONS: None immediate. PROCEDURE: The procedure was explained to the patient. The risks and benefits of the procedure were discussed and the patient's questions were addressed. Informed consent was obtained from the patient. Patient was placed on the interventional table. A 5 French nasogastric tube was easily advanced into the stomach using fluoroscopy. The anterior abdomen was prepped and draped in sterile fashion. Maximal barrier sterile technique was utilized including caps, mask, sterile gowns, sterile gloves, sterile drape, hand hygiene and skin antiseptic. This stomach was insufflated with gas. Patient has a large amount of bowel gas throughout the abdomen. Stomach has a horizontal orientation and majority the stomach is underneath the xiphoid process and anterior ribs. Skin was anesthetized with 1% lidocaine. Attempted to advance a needle into the stomach but needle placement would require a severe caudal to cranial angulation. Unfortunately, there were multiple gas-filled loops of bowel just underneath the stomach and lateral view raised concern that the needle may traverse a loop of bowel prior to entering the stomach. Therefore, cone beam  CT was performed in order to better evaluate the anatomy. Additional gas was placed in the stomach but a safe percutaneous window could not be identified for gastrostomy tube placement. Therefore, the gastrostomy tube procedure was not performed. FINDINGS: Stomach has a very horizontal orientation and despite insufflation with air, majority of the stomach is underneath the patient's anterior ribs and the xiphoid process. In addition, there is a large amount of gas-filled bowel underneath the stomach which limits our ability to advance the needle into the stomach from a caudal to cranial orientation. Safe percutaneous window was not identified for gastrostomy tube placement. IMPRESSION: 1. Percutaneous gastrostomy tube procedure  was aborted due to difficult anatomy and cannot identify a safe percutaneous window for gastrostomy tube placement. Recommend surgical consultation for surgical gastrostomy or jejunal feeding tube. Electronically Signed   By: Markus Daft M.D.   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 Percutaneous gastrostomy tube procedure was aborted due to difficult anatomy and could not identify a safe percutaneous window. EXAM: PLACEMENT OF NASOGASTRIC FEEDING TUBE WITH FLUOROSCOPY Physician: Stephan Minister. Anselm Pancoast, MD MEDICATIONS: None ANESTHESIA/SEDATION: None FLUOROSCOPY: Radiation Exposure Index (as provided by the fluoroscopic device): 93.7 mGy Kerma COMPLICATIONS: None immediate. PROCEDURE: Patient was placed on the interventional table. A Cortrak tube was easily advanced down the right nostril into the stomach with fluoroscopy. The tube was preferentially coiling in the gastric fundus. Eventually, the tube was directed toward the distal stomach and placed near the duodenal bulb. Contrast injection confirmed placement near the duodenal bulb. The tube was flushed with saline. Tube was secured to the patient's nose. FINDINGS: Feeding tube tip is near the duodenal bulb. IMPRESSION: Successful placement of a nasogastric feeding tube with fluoroscopy. Feeding tube is ready to be used. Electronically Signed   By: Markus Daft M.D.   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 Malnutrition due to difficulty eating and drinking. EXAM: ATTEMPTED PERCUTANEOUS GASTROSTOMY TUBE PLACEMENT WITH FLUOROSCOPY AND CONE BEAM CT Physician: Stephan Minister. Anselm Pancoast, MD MEDICATIONS: Ancef 2 g; Antibiotics were administered within 1 hour of the procedure. Glucagon 0.5 mg ANESTHESIA/SEDATION: Moderate (conscious) sedation was  employed during this procedure. A total of Versed 3.'0mg'$  and fentanyl 150 mcg was administered intravenously at the order of the provider performing the procedure. Total intra-service moderate sedation time: 44 minutes. Patient's level of consciousness and vital signs were monitored continuously by radiology nurse throughout the procedure under the supervision of the provider performing the procedure. FLUOROSCOPY: Radiation Exposure Index (as provided by the fluoroscopic device): 65 mGy Kerma COMPLICATIONS: None immediate. PROCEDURE: The procedure was explained to the patient. The risks and benefits of the procedure were discussed and the patient's questions were addressed. Informed consent was obtained from the patient. Patient was placed on the interventional table. A 5 French nasogastric tube was easily advanced into the stomach using fluoroscopy. The anterior abdomen was prepped and draped in sterile fashion. Maximal barrier sterile technique was utilized including caps, mask, sterile gowns, sterile gloves, sterile drape, hand hygiene and skin antiseptic. This stomach was insufflated with gas. Patient has a large amount of bowel gas throughout the abdomen. Stomach has a horizontal orientation and majority the stomach is underneath the xiphoid process and anterior ribs. Skin was anesthetized with 1% lidocaine. Attempted to  advance a needle into the stomach but needle placement would require a severe caudal to cranial angulation. Unfortunately, there were multiple gas-filled loops of bowel just underneath the stomach and lateral view raised concern that the needle may traverse a loop of bowel prior to entering the stomach. Therefore, cone beam CT was performed in order to better evaluate the anatomy. Additional gas was placed in the stomach but a safe percutaneous window could not be identified for gastrostomy tube placement. Therefore, the gastrostomy tube procedure was not performed. FINDINGS: Stomach has a very  horizontal orientation and despite insufflation with air, majority of the stomach is underneath the patient's anterior ribs and the xiphoid process. In addition, there is a large amount of gas-filled bowel underneath the stomach which limits our ability to advance the needle into the stomach from a caudal to cranial orientation. Safe percutaneous window was not identified for gastrostomy tube placement. IMPRESSION: 1. Percutaneous gastrostomy tube procedure was aborted due to difficult anatomy and cannot identify a safe percutaneous window for gastrostomy tube placement. Recommend surgical consultation for surgical gastrostomy or jejunal feeding tube. Electronically Signed   By: Markus Daft M.D.   On: 03/17/2021 17:27       Discharge Exam: Vitals:   03/20/21 0747 03/20/21 1200  BP: (!) 101/59 99/61  Pulse: (!) 54   Resp: 19 20  Temp: 98.5 F (36.9 C)   SpO2:  93%     General: Pt is alert, awake, not in acute distress Cardiovascular: RRR, S1/S2 +, no edema Respiratory: CTA bilaterally, no wheezing, no rhonchi, no respiratory distress, no conversational dyspnea  Abdominal: Soft, NT, ND, bowel sounds + Extremities: no edema, no cyanosis Psych: Normal mood and affect, stable judgement and insight     The results of significant diagnostics from this hospitalization (including imaging, microbiology, ancillary and laboratory) are listed below for reference.     Microbiology: Recent Results (from the past 240 hour(s))  SARS Coronavirus 2 by RT PCR (hospital order, performed in [facility] hospital lab) Nasopharyngeal Nasopharyngeal Swab     Status: None   Collection Time: 03/18/21  4:11 PM   Specimen: Nasopharyngeal Swab  Result Value Ref Range Status   SARS Coronavirus 2 NEGATIVE NEGATIVE Final    Comment: (NOTE) SARS-CoV-2 target nucleic acids are NOT DETECTED.  The SARS-CoV-2 RNA is generally detectable in upper and lower respiratory specimens during the acute phase of infection. The  lowest concentration of SARS-CoV-2 viral copies this assay can detect is 250 copies / mL. A negative result does not preclude SARS-CoV-2 infection and should not be used as the sole basis for treatment or other patient management decisions.  A negative result may occur with improper specimen collection / handling, submission of specimen other than nasopharyngeal swab, presence of viral mutation(s) within the areas targeted by this assay, and inadequate number of viral copies (<250 copies / mL). A negative result must be combined with clinical observations, patient history, and epidemiological information.  Fact Sheet for Patients:   StrictlyIdeas.no  Fact Sheet for Healthcare Providers: BankingDealers.co.za  This test is not yet approved or  cleared by the [place] FDA and has been authorized for detection and/or diagnosis of SARS-CoV-2 by FDA under an Emergency Use Authorization (EUA).  This EUA will remain in effect (meaning this test can be used) for the duration of the COVID-19 declaration under Section 564(b)(1) of the Act, 21 U.S.C. section 360bbb-3(b)(1), unless the authorization is terminated or revoked sooner.  Performed at [facility], [place] [address] [place], [place] [address]      Labs: BNP (last 3 results) No results for input(s): BNP in the last 8760 hours. Basic Metabolic Panel: Recent Labs  Lab 03/18/21 0114 03/18/21 0650 03/18/21 2343 03/20/21 0155  NA 142  --  140 140  K 3.9  --  3.9 3.9  CL 108  --  107 106  CO2 25  --  26 28  GLUCOSE 162*  --  221* 313*  BUN 27*  --  20 18  CREATININE 1.03  --  0.81 0.85  CALCIUM 8.4*  --  8.1* 8.4*  MG  --  1.9 2.2 1.9  PHOS  --  2.3* 3.1 1.9*   Liver Function Tests: Recent Labs  Lab 03/18/21 0650 03/20/21 0155  AST 48* 27  ALT 85* 45*  ALKPHOS 73 70  BILITOT 1.0 0.7  PROT 5.9* 5.3*  ALBUMIN 2.9* 2.6*   No results for input(s): LIPASE, AMYLASE  in the last 168 hours. No results for input(s): AMMONIA in the last 168 hours. CBC: Recent Labs  Lab 03/17/21 0945  WBC 8.3  NEUTROABS 6.5  HGB 13.7  HCT 40.3  MCV 86.5  PLT 296   Cardiac Enzymes: No results for input(s): CKTOTAL, CKMB, CKMBINDEX, TROPONINI in the last 168 hours. BNP: Invalid input(s): POCBNP CBG: Recent Labs  Lab 03/19/21 2019 03/19/21 2350 03/20/21 0309 03/20/21 0745 03/20/21 1159  GLUCAP 282* 332* 284* 322* 239*   D-Dimer No results for input(s): DDIMER in the last 72 hours. Hgb A1c Recent Labs    03/18/21 0114 03/18/21 2343  HGBA1C 5.2 5.3   Lipid Profile No results for input(s): CHOL, HDL, LDLCALC, TRIG, CHOLHDL, LDLDIRECT in the last 72 hours. Thyroid function studies No results for input(s): TSH, T4TOTAL, T3FREE, THYROIDAB in the last 72 hours.  Invalid input(s): FREET3 Anemia work up No results for input(s): VITAMINB12, FOLATE, FERRITIN, TIBC, IRON, RETICCTPCT in the last 72 hours. Urinalysis No results found for: COLORURINE, APPEARANCEUR, [place], [place], [place], [place], [place], [place], PROTEINUR, UROBILINOGEN, NITRITE, LEUKOCYTESUR Sepsis Labs Invalid input(s): PROCALCITONIN,  WBC,  LACTICIDVEN Microbiology Recent Results (from the past 240 hour(s))  SARS Coronavirus 2 by RT PCR (hospital order, performed in [facility] hospital lab) Nasopharyngeal Nasopharyngeal Swab     Status: None   Collection Time: 03/18/21  4:11 PM   Specimen: Nasopharyngeal Swab  Result Value Ref Range Status   SARS Coronavirus 2 NEGATIVE NEGATIVE Final    Comment: (NOTE) SARS-CoV-2 target nucleic acids are NOT DETECTED.  The SARS-CoV-2 RNA is generally detectable in upper and lower respiratory specimens during the acute phase of infection. The lowest concentration of SARS-CoV-2 viral copies this assay can detect is 250 copies / mL. A negative result does not preclude SARS-CoV-2 infection and should not be used as the sole basis for treatment or  other patient management decisions.  A negative result may occur with improper specimen collection / handling, submission of specimen other than nasopharyngeal swab, presence of viral mutation(s) within the areas targeted by this assay, and inadequate number of viral copies (<250 copies / mL). A negative result must be combined with clinical observations, patient history, and epidemiological information.  Fact Sheet for Patients:   StrictlyIdeas.no  Fact Sheet for Healthcare Providers: BankingDealers.co.za  This test is not yet approved or  cleared by the [place] FDA and has been authorized for detection and/or diagnosis of SARS-CoV-2 by FDA under an Emergency Use Authorization (EUA).  This EUA  will remain in effect (meaning this test can be used) for the duration of the COVID-19 declaration under Section 564(b)(1) of the Act, 21 U.S.C. section 360bbb-3(b)(1), unless the authorization is terminated or revoked sooner.  Performed at [facility], [place] [address], [place], [place] [address]      Patient was seen and examined on the day of discharge and was found to be in stable condition. Time coordinating discharge: 35 minutes including assessment and coordination of care, as well as examination of the patient.   SIGNED:  Dessa Phi, DO [facility] 03/20/2021, 2:59 PM

## 2021-03-20 NOTE — TOC Transition Note (Signed)
Transition of Care (TOC) - CM/SW Discharge Note ? ? ?Patient Details  ?Name: Shawn Rhodes ?MRN: [id number] ?Date of Birth: 03/29/1975 ? ?Transition of Care (TOC) CM/SW Contact:  ?Cyndi Bender, RN ?Phone Number: ?03/20/2021, 3:30 PM ? ? ?Clinical Narrative:    ?Patient stable for discharge.  ?Outpatient SLP referral sent. Information on the AVS. ?Mother at the bedside to transport home. ?Patient has all needed items for tube feeds at home.Education complete. ? ? ?  ?Barriers to Discharge: Barriers Resolved ? ? ?Patient Goals and CMS Choice ?Patient states their goals for this hospitalization and ongoing recovery are:: go home ?  ?  ? ?Discharge Placement ?  ?           ? home ?  ?  ?  ? ?Discharge Plan and Services ?  ?Discharge Planning Services: CM Consult ?           ?DME Arranged: Tube feeding ?DME Agency: Other - Comment [place] [facility]) ?Date DME Agency Contacted: 03/20/21 ?Time DME Agency Contacted: 1165 ?Representative spoke with at DME Agency: Frutoso Schatz ?  ?  ?  ?  ?  ? ?Social Determinants of Health (SDOH) Interventions ?  ? ? ?Readmission Risk Interventions ?Readmission Risk Prevention Plan 03/20/2021  ?Post Dischage Appt Complete  ?Medication Screening Complete  ?Transportation Screening Complete  ?Some recent data might be hidden  ? ? ? ? ? ?

## 2021-03-20 NOTE — Progress Notes (Signed)
Nutrition Follow-up ? ?DOCUMENTATION CODES:  ? ?Not applicable ? ?INTERVENTION:  ? ?Transition to bolus tube feeds via G-tube: ?355 mL Osmolite 1.5 - QID ?45 mL ProSource TF - Daily ?150 mL free water flushes q4h  ?Provides 2170 kcal, 100 gm PRO, 1086 mL free water (1986 mL total free water) daily.  ? ?NUTRITION DIAGNOSIS:  ? ?Inadequate oral intake related to cancer and cancer related treatments as evidenced by NPO status. - Ongoing  ? ?GOAL:  ? ?Patient will meet greater than or equal to 90% of their needs - Progressing ? ?MONITOR:  ? ?Labs, Weight trends, TF tolerance ? ?REASON FOR ASSESSMENT:  ? ?Consult ?Assessment of nutrition requirement/status, Enteral/tube feeding initiation and management ? ?ASSESSMENT:  ? ?47 y/o male with h/o IDDM, multiple myeloma and SCC of the tongue s/p partial glossectomy with selective right Lvl 2-4 neck dissection 07/2020 and revision parital glosectomy 08/2020 s/p PET scan in Jan 2023 with concerns for recurrence and increased uptake in tongue and R cervical node with no treatment plan yet as family still getting second opionions who is admitted with FTT. ? ?3/10 - NGT placed ?3/12 - G-tube placed ? ?Pt is able to take ice chips and some fluids by mouth. Pt denies any nausea, vomiting, or diarrhea.  ?Pt switched to Osmolite 1.2 due to high CBG's.  ?Pts mother reports that he is supposed to be discharged today. Spoke with home health company rep to determine available tube feeding formulas for pt. Glucerna is not currently available.  ? ?Discussed with pt and mother at home regimen for bolus tube feeds. Answered any questions that they had.  ? ?Medications reviewed and include: SSI 0-9 units q4h, Semglee  ?Labs reviewed: Phosphorus 1.9, 24 hr CBG 182-332 ? ?Diet Order:   ?Diet Order   ? ?       ?  Diet NPO time specified Except for: [employer], Sips with Meds  Diet effective now       ?  ? ?  ?  ? ?  ? ? ?EDUCATION NEEDS:  ? ?Education needs have been addressed ? ?Skin:  Skin  Assessment: Reviewed RN Assessment (incision abdomen) ? ?Last BM:  PTA ? ?Height:  ? ?Ht Readings from Last 1 Encounters:  ?03/19/21 5' 10"  (1.778 m)  ? ? ?Weight:  ? ?Wt Readings from Last 1 Encounters:  ?03/19/21 65.7 kg  ? ? ?Ideal Body Weight:  75.5 kg ? ?BMI:  Body mass index is 20.78 kg/m?. ? ?Estimated Nutritional Needs:  ? ?Kcal:  2000-2200 ? ?Protein:  100-115 grams ? ?Fluid:  >/= 2 L ? ? ? ?Hermina Barters RD, LDN ?Clinical Dietitian ?See AMiON for contact information.  ? ?

## 2021-03-20 NOTE — Progress Notes (Signed)
Modified Barium Swallow Progress Note ? ?Patient Details  ?Name: Shawn Rhodes ?MRN: [id number] ?Date of Birth: 1975/09/18 ? ?Today's Date: 03/20/2021 ? ?Modified Barium Swallow completed.  Full report located under Chart Review in the Imaging Section. ? ?Brief recommendations include the following: ? ?Clinical Impression ? Pt presents with an oropharyngeal dysphagia, largely impacted by tongue but also with reduced sensation. Pt has limited lingual mobility and control, impacting his bolus cohesion as he primarily lets thin liquids spill passively to his pharynx by means of a head tilt upward. Reduced tongue base retraction and limited hyolaryngeal excursion also reduces epiglottic deflection. Thin liquids enter the laryngeal vestibule before the swallow with penetration more than aspiration occurring, as his laryngeal squeeze can help move thin liquids upward within his laryngeal vestibule except when he took large, consecutive sips, at which time silent aspiration was observed.He does also have penetration and an episode of aspiration after the swallow due to residuals in the valleculae/pyriform sinuses. Although penetration and intermittent aspiration are silent, his cued cough is quite effective at clearing his airway, although he does need a second swallow to prevent the barium from falling back into his airway. Pt says he plans to use his g-tube for primary nutrition. In light of his lingual pain with swallowing and dysphagia as noted above, this appears to be most functional. However, encouraged pt to continue to try to swallow small amounts for pleasure and therapeutic use of swallowing musculature. Encouraged small amounts of ice, thin liquids via small single cup sips, and/or purees. Would cough and re-swallow to try to increase airway protection, but would also focus on frequent oral care and remaining active in order to reduce the risk of dysphagia related adverse events as much as possible. Noted that  pt's vocal quality also becomes wet without boluses, concerning for intermittent penetration/aspiration with secretions as well. Encouraged him to cough and re-swallow any time he observes this change in vocal quality. Given difficulty also with secretions, could consider using primarily water and ice after oral care. Would encourage OP SLP upon discharge to address swallowing as well as speech. Pt and his mother were both educated and in agreement. ?  ?Swallow Evaluation Recommendations ? ? SLP Diet Recommendations: Alternative means - long-term (g-tube for primary nutrition/hydration with small sips of water, ice chips, or bites of puree for pleasure) ? ? Liquid Administration via: Cup ? ? Medication Administration: Via alternative means ? ? Compensations: Slow rate;Small sips/bites;Hard cough after swallow;Multiple dry swallows after each bite/sip ? ? Postural Changes: Seated upright at 90 degrees;Remain semi-upright after after feeds/meals (Comment) ? ? Oral Care Recommendations: Oral care QID ? ?   ? ? ? ?Osie Bond., M.A. CCC-SLP ?Acute Rehabilitation Services ?Pager [phone] ?Office [phone] ? ?03/20/2021,3:13 PM ?

## 2021-03-20 NOTE — Progress Notes (Signed)
Patient ID: Shawn Rhodes, male   DOB: July 25, 1975, 46 y.o.   MRN: [id number] ?[facility] ?Progress Note ? ?1 Day Post-Op  ?Subjective: ?CC-  ?Mother at bedside. No abdominal complaints. Tolerating tube feedings. Denies n/v. ? ?Objective: ?Vital signs in last 24 hours: ?Temp:  [97.6 ?F (36.4 ?C)-99.2 ?F (37.3 ?C)] 98.5 ?F (36.9 ?C) (03/13 0747) ?Pulse Rate:  [51-70] 54 (03/13 0747) ?Resp:  [15-21] 19 (03/13 0747) ?BP: (101-132)/(54-73) 101/59 (03/13 0747) ?SpO2:  [91 %-100 %] 99 % (03/13 0314) ?  ? ?Intake/Output from previous day: ?03/12 0701 - 03/13 0700 ?In: 1234.3 [I.V.:600; NG/GT:583.7; IV Piggyback:50.7] ?Out: 25 [Blood:25] ?Intake/Output this shift: ?No intake/output data recorded. ? ?PE: ?Gen:  Alert, NAD, pleasant ?Abd: soft, ND, NT, G tube site cdi with TF's running at 60cc/hr, lap incisions cdi ? ?Lab Results:  ?Recent Labs  ?  03/17/21 ?0945  ?WBC 8.3  ?HGB 13.7  ?HCT 40.3  ?PLT 296  ? ?BMET ?Recent Labs  ?  03/18/21 ?2343 03/20/21 ?0155  ?NA 140 140  ?K 3.9 3.9  ?CL 107 106  ?CO2 26 28  ?GLUCOSE 221* 313*  ?BUN 20 18  ?CREATININE 0.81 0.85  ?CALCIUM 8.1* 8.4*  ? ?PT/INR ?Recent Labs  ?  03/17/21 ?0945  ?LABPROT 14.3  ?INR 1.1  ? ?CMP  ?   ?Component Value Date/Time  ? NA 140 03/20/2021 0155  ? K 3.9 03/20/2021 0155  ? CL 106 03/20/2021 0155  ? CO2 28 03/20/2021 0155  ? GLUCOSE 313 (H) 03/20/2021 0155  ? BUN 18 03/20/2021 0155  ? CREATININE 0.85 03/20/2021 0155  ? CALCIUM 8.4 (L) 03/20/2021 0155  ? PROT 5.3 (L) 03/20/2021 0155  ? ALBUMIN 2.6 (L) 03/20/2021 0155  ? AST 27 03/20/2021 0155  ? ALT 45 (H) 03/20/2021 0155  ? ALKPHOS 70 03/20/2021 0155  ? BILITOT 0.7 03/20/2021 0155  ? GFRNONAA >60 03/20/2021 0155  ? ?Lipase  ?No results found for: LIPASE ? ? ? ? ?Studies/Results: ?No results found. ? ?Anti-infectives: ?Anti-infectives (From admission, onward)  ? ? Start     Dose/Rate Route Frequency Ordered Stop  ? 03/19/21 0600  ceFAZolin (ANCEF) IVPB 2g/100 mL premix       ? 2 g ?200 mL/hr over  30 Minutes Intravenous On call to O.R. 03/18/21 1343 03/19/21 0615  ? ?  ? ? ? ?Assessment/Plan ?SCC of the tongue ?POD#1 s/p laparoscopic gastrostomy tube insertion 3/12 Dr. Kieth Brightly ?- G tube site cdi ?- tolerating tube feedings ?- Stable from surgical standpoint. I will arrange follow up and place on AVS. Remainder of management per primary team. We will sign off, please call with questions or concerns.  ? ? LOS: 3 days  ? ? ?Wellington Hampshire, PA-C ?[facility] ?03/20/2021, 8:53 AM ?Please see Amion for pager number during day hours 7:00am-4:30pm ? ?

## 2021-03-20 NOTE — Progress Notes (Signed)
Inpatient Diabetes Program Recommendations ? ?AACE/ADA: New Consensus Statement on Inpatient Glycemic Control (2015) ? ?Target Ranges:  Prepandial:   less than 140 mg/dL ?     Peak postprandial:   less than 180 mg/dL (1-2 hours) ?     Critically ill patients:  140 - 180 mg/dL  ? ?Lab Results  ?Component Value Date  ? GLUCAP 322 (H) 03/20/2021  ? HGBA1C 5.3 03/18/2021  ? ? ?Review of Glycemic Control ? Latest Reference Range & Units 03/19/21 20:19 03/19/21 23:50 03/20/21 03:09 03/20/21 07:45  ?Glucose-Capillary 70 - 99 mg/dL 282 (H) 332 (H) 284 (H) 322 (H)  ?(H): Data is abnormally high ?Diabetes history: no noted dm ?Outpatient Diabetes medications: none ?Current orders for Inpatient glycemic control: Semglee 10 units QD, Novolog 0-9 units Q4H ?Osmolite @ 60 ml/hr ? ?Inpatient Diabetes Program Recommendations:   ? ?Consider adding Novolog 3 units Q4H for tube feed coverage (to be stopped or held in the event tube feeds are stopped).  ? ?Thanks, ?Bronson Curb, MSN, RNC-OB ?Diabetes Coordinator ?[phone] (8a-5p) ? ? ? ?

## 2021-04-17 DIAGNOSIS — R918 Other nonspecific abnormal finding of lung field: Secondary | ICD-10-CM | POA: Diagnosis not present

## 2021-04-17 DIAGNOSIS — Z791 Long term (current) use of non-steroidal anti-inflammatories (NSAID): Secondary | ICD-10-CM | POA: Diagnosis not present

## 2021-04-17 DIAGNOSIS — Z931 Gastrostomy status: Secondary | ICD-10-CM | POA: Diagnosis not present

## 2021-04-17 DIAGNOSIS — C029 Malignant neoplasm of tongue, unspecified: Secondary | ICD-10-CM | POA: Diagnosis not present

## 2021-04-17 DIAGNOSIS — E119 Type 2 diabetes mellitus without complications: Secondary | ICD-10-CM | POA: Diagnosis not present

## 2021-04-17 DIAGNOSIS — J984 Other disorders of lung: Secondary | ICD-10-CM | POA: Diagnosis not present

## 2021-04-17 DIAGNOSIS — Z79899 Other long term (current) drug therapy: Secondary | ICD-10-CM | POA: Diagnosis not present

## 2021-04-17 DIAGNOSIS — E876 Hypokalemia: Secondary | ICD-10-CM | POA: Diagnosis not present

## 2021-04-17 DIAGNOSIS — Z794 Long term (current) use of insulin: Secondary | ICD-10-CM | POA: Diagnosis not present

## 2021-04-17 DIAGNOSIS — D649 Anemia, unspecified: Secondary | ICD-10-CM | POA: Diagnosis not present

## 2021-04-18 DIAGNOSIS — E109 Type 1 diabetes mellitus without complications: Secondary | ICD-10-CM | POA: Diagnosis not present

## 2021-04-18 DIAGNOSIS — Z452 Encounter for adjustment and management of vascular access device: Secondary | ICD-10-CM | POA: Diagnosis not present

## 2021-04-28 DIAGNOSIS — T85898A Other specified complication of other internal prosthetic devices, implants and grafts, initial encounter: Secondary | ICD-10-CM | POA: Diagnosis not present

## 2021-04-28 DIAGNOSIS — E109 Type 1 diabetes mellitus without complications: Secondary | ICD-10-CM | POA: Diagnosis not present

## 2021-04-28 DIAGNOSIS — Z452 Encounter for adjustment and management of vascular access device: Secondary | ICD-10-CM | POA: Diagnosis not present

## 2021-04-28 DIAGNOSIS — R509 Fever, unspecified: Secondary | ICD-10-CM | POA: Diagnosis not present

## 2021-05-02 DIAGNOSIS — R918 Other nonspecific abnormal finding of lung field: Secondary | ICD-10-CM | POA: Diagnosis not present

## 2021-05-02 DIAGNOSIS — R4182 Altered mental status, unspecified: Secondary | ICD-10-CM | POA: Diagnosis not present

## 2021-06-26 DIAGNOSIS — Z931 Gastrostomy status: Secondary | ICD-10-CM | POA: Diagnosis not present

## 2021-06-26 DIAGNOSIS — E109 Type 1 diabetes mellitus without complications: Secondary | ICD-10-CM | POA: Diagnosis not present

## 2021-06-26 DIAGNOSIS — C029 Malignant neoplasm of tongue, unspecified: Secondary | ICD-10-CM | POA: Diagnosis not present

## 2021-06-29 DIAGNOSIS — E7800 Pure hypercholesterolemia, unspecified: Secondary | ICD-10-CM | POA: Diagnosis not present

## 2021-06-29 DIAGNOSIS — C029 Malignant neoplasm of tongue, unspecified: Secondary | ICD-10-CM | POA: Diagnosis not present

## 2021-07-24 ENCOUNTER — Encounter (HOSPITAL_COMMUNITY): Payer: Self-pay | Admitting: Emergency Medicine

## 2021-07-24 ENCOUNTER — Emergency Department (HOSPITAL_COMMUNITY)
Admission: EM | Admit: 2021-07-24 | Discharge: 2021-07-24 | Disposition: A | Discharge status: Other Acute Inpatient Hospital | Payer: 59 | Attending: Emergency Medicine | Admitting: Emergency Medicine

## 2021-07-24 ENCOUNTER — Emergency Department (HOSPITAL_COMMUNITY): Payer: 59

## 2021-07-24 DIAGNOSIS — L0211 Cutaneous abscess of neck: Secondary | ICD-10-CM | POA: Diagnosis not present

## 2021-07-24 DIAGNOSIS — Z85810 Personal history of malignant neoplasm of tongue: Secondary | ICD-10-CM | POA: Insufficient documentation

## 2021-07-24 DIAGNOSIS — C760 Malignant neoplasm of head, face and neck: Secondary | ICD-10-CM | POA: Diagnosis not present

## 2021-07-24 DIAGNOSIS — Z0389 Encounter for observation for other suspected diseases and conditions ruled out: Secondary | ICD-10-CM | POA: Diagnosis not present

## 2021-07-24 DIAGNOSIS — R9431 Abnormal electrocardiogram [ECG] [EKG]: Secondary | ICD-10-CM | POA: Diagnosis not present

## 2021-07-24 DIAGNOSIS — Z794 Long term (current) use of insulin: Secondary | ICD-10-CM | POA: Diagnosis not present

## 2021-07-24 DIAGNOSIS — E119 Type 2 diabetes mellitus without complications: Secondary | ICD-10-CM | POA: Insufficient documentation

## 2021-07-24 DIAGNOSIS — R221 Localized swelling, mass and lump, neck: Secondary | ICD-10-CM | POA: Diagnosis not present

## 2021-07-24 DIAGNOSIS — D649 Anemia, unspecified: Secondary | ICD-10-CM | POA: Insufficient documentation

## 2021-07-24 DIAGNOSIS — D72829 Elevated white blood cell count, unspecified: Secondary | ICD-10-CM | POA: Insufficient documentation

## 2021-07-24 DIAGNOSIS — R791 Abnormal coagulation profile: Secondary | ICD-10-CM | POA: Diagnosis not present

## 2021-07-24 LAB — CBC WITH DIFFERENTIAL/PLATELET
Abs Immature Granulocytes: 0.06 10*3/uL (ref 0.00–0.07)
Basophils Absolute: 0 10*3/uL (ref 0.0–0.1)
Basophils Relative: 0 %
Eosinophils Absolute: 0 10*3/uL (ref 0.0–0.5)
Eosinophils Relative: 0 %
HCT: 27.1 % — ABNORMAL LOW (ref 39.0–52.0)
Hemoglobin: 8.8 g/dL — ABNORMAL LOW (ref 13.0–17.0)
Immature Granulocytes: 1 %
Lymphocytes Relative: 8 %
Lymphs Abs: 0.9 10*3/uL (ref 0.7–4.0)
MCH: 29.9 pg (ref 26.0–34.0)
MCHC: 32.5 g/dL (ref 30.0–36.0)
MCV: 92.2 fL (ref 80.0–100.0)
Monocytes Absolute: 0.7 10*3/uL (ref 0.1–1.0)
Monocytes Relative: 6 %
Neutro Abs: 9.6 10*3/uL — ABNORMAL HIGH (ref 1.7–7.7)
Neutrophils Relative %: 85 %
Platelets: 416 10*3/uL — ABNORMAL HIGH (ref 150–400)
RBC: 2.94 MIL/uL — ABNORMAL LOW (ref 4.22–5.81)
RDW: 11.9 % (ref 11.5–15.5)
WBC: 11.3 10*3/uL — ABNORMAL HIGH (ref 4.0–10.5)
nRBC: 0 % (ref 0.0–0.2)

## 2021-07-24 LAB — URINALYSIS, ROUTINE W REFLEX MICROSCOPIC
Bilirubin Urine: NEGATIVE
Glucose, UA: NEGATIVE mg/dL
Hgb urine dipstick: NEGATIVE
Ketones, ur: NEGATIVE mg/dL
Leukocytes,Ua: NEGATIVE
Nitrite: NEGATIVE
Protein, ur: NEGATIVE mg/dL
Specific Gravity, Urine: 1.017 (ref 1.005–1.030)
pH: 5 (ref 5.0–8.0)

## 2021-07-24 LAB — COMPREHENSIVE METABOLIC PANEL
ALT: 15 U/L (ref 0–44)
AST: 14 U/L — ABNORMAL LOW (ref 15–41)
Albumin: 2.4 g/dL — ABNORMAL LOW (ref 3.5–5.0)
Alkaline Phosphatase: 103 U/L (ref 38–126)
Anion gap: 12 (ref 5–15)
BUN: 19 mg/dL (ref 6–20)
CO2: 27 mmol/L (ref 22–32)
Calcium: 8.7 mg/dL — ABNORMAL LOW (ref 8.9–10.3)
Chloride: 96 mmol/L — ABNORMAL LOW (ref 98–111)
Creatinine, Ser: 0.63 mg/dL (ref 0.61–1.24)
GFR, Estimated: >60 mL/min (ref >60)
Glucose, Bld: 147 mg/dL — ABNORMAL HIGH (ref 70–99)
Potassium: 3.9 mmol/L (ref 3.5–5.1)
Sodium: 135 mmol/L (ref 135–145)
Total Bilirubin: 0.8 mg/dL (ref 0.3–1.2)
Total Protein: 6.5 g/dL (ref 6.5–8.1)

## 2021-07-24 LAB — PROTIME-INR
INR: 1.2 (ref 0.8–1.2)
Prothrombin Time: 15.5 seconds — ABNORMAL HIGH (ref 11.4–15.2)

## 2021-07-24 LAB — APTT: aPTT: 35 seconds (ref 24–36)

## 2021-07-24 LAB — LACTIC ACID, PLASMA: Lactic Acid, Venous: 0.9 mmol/L (ref 0.5–1.9)

## 2021-07-24 MED ORDER — PIPERACILLIN-TAZOBACTAM 3.375 G IVPB: 3.3750 g | Freq: Three times a day (TID) | INTRAVENOUS | Status: DC

## 2021-07-24 MED ORDER — PIPERACILLIN-TAZOBACTAM 3.375 G IVPB 30 MIN
3.3750 g | Freq: Once | INTRAVENOUS | Status: AC
Start: 2021-07-24 — End: 2021-07-24
  Administered 2021-07-24: 3.375 g via INTRAVENOUS
  Filled 2021-07-24: qty 50

## 2021-07-24 MED ORDER — IOHEXOL 300 MG/ML  SOLN
75.0000 mL | Freq: Once | INTRAMUSCULAR | Status: AC | PRN
  Administered 2021-07-24: 75 mL via INTRAVENOUS

## 2021-07-24 NOTE — ED Provider Triage Note (Signed)
Emergency Medicine Provider Triage Evaluation Note  Shawn Rhodes , a 46 y.o. male  was evaluated in triage.  Pt complains of abscess forming under chin for three days that has been draining today. The patient is unable to speak at baseline. H/o glossopharyngeal cancer undergoing "other immunotherapy". Unsure of any fevers.  Review of Systems  Positive:  Negative:   Physical Exam  BP 97/62 (BP Location: Right Arm)   Pulse 76   Temp 98.8 F (37.1 C) (Oral)   Resp 18   Ht '5\' 10"'$  (1.778 m)   Wt 66 kg   SpO2 100%   BMI 20.88 kg/m  Gen:   Awake, no distress   Resp:  Normal effort  MSK:   Moves extremities without difficulty  Other:  Large draining abscess under the patient's chin. Actively draining. Patient is unable to open his mouth fully at baseline. Unable to visualize.        Medical Decision Making  Medically screening exam initiated at 5:18 PM.  Appropriate orders placed.  Jahsir Rama was informed that the remainder of the evaluation will be completed by another provider, this initial triage assessment does not replace that evaluation, and the importance of remaining in the ED until their evaluation is complete.  Ordering sepsis work up as well as CT imaging. Charge nurse alerted patient needs to be roomed NOW. Discussed case with my attending.    Sherrell Puller, PA-C 07/24/21 1722

## 2021-07-24 NOTE — ED Triage Notes (Signed)
Pt has hx of tongue cancer. Pt noticed redness and wound under his chin that began oozing. Pt has swelling to right side of face. Endorses trouble swallowing.

## 2021-07-24 NOTE — ED Provider Notes (Signed)
[facility] EMERGENCY DEPARTMENT Provider Note   CSN: [id number] Arrival date & time: 07/24/21  1348     History  Chief Complaint  Patient presents with   Abscess    Shawn Rhodes is a 46 y.o. male.  The history is provided by the patient, a relative and medical records. No language interpreter was used.  Abscess Location:  Head/neck Abscess quality: draining, fluctuance, induration, painful, redness and warmth   Red streaking: no   Duration:  4 days Progression:  Worsening Pain details:    Quality:  Dull   Severity:  Moderate   Progression:  Worsening Chronicity:  New Context: diabetes   Relieved by:  Nothing Worsened by:  Nothing Ineffective treatments:  None tried Associated symptoms: no fatigue, no fever, no headaches, no nausea and no vomiting        Home Medications Prior to Admission medications   Medication Sig Start Date End Date Taking? Authorizing Provider  gabapentin (NEURONTIN) 300 MG capsule Take 300 mg by mouth 3 (three) times daily.    [provider]  insulin aspart (NOVOLOG FLEXPEN) 100 UNIT/ML FlexPen Inject 1-15 Units into the skin 3 (three) times daily with meals.    [provider]  insulin glargine (LANTUS SOLOSTAR) 100 UNIT/ML Solostar Pen Inject 8-10 Units into the skin 2 (two) times daily.    [provider]  Nutritional Supplements (FEEDING SUPPLEMENT, OSMOLITE 1.5 CAL,) LIQD Place 355 mLs into feeding tube 4 (four) times daily. 03/20/21   Dessa Phi, DO  Nutritional Supplements (FEEDING SUPPLEMENT, PROSOURCE TF,) liquid Place 45 mLs into feeding tube daily. 03/21/21   Dessa Phi, DO  oxyCODONE (OXY IR/ROXICODONE) 5 MG immediate release tablet Take 5-10 mg by mouth every 4 (four) hours as needed. 02/10/21   [provider]      Allergies    Patient has no known allergies.    Review of Systems   Review of Systems  Constitutional:  Positive for chills. Negative for fatigue and  fever.  HENT:  Positive for sore throat. Negative for congestion.   Eyes:  Negative for visual disturbance.  Respiratory:  Negative for cough, chest tightness, shortness of breath and wheezing.   Gastrointestinal:  Negative for constipation, diarrhea, nausea and vomiting.  Genitourinary:  Negative for flank pain.  Musculoskeletal:  Positive for neck pain. Negative for back pain.  Skin:  Positive for wound. Negative for rash.  Neurological:  Negative for light-headedness and headaches.  Psychiatric/Behavioral:  Negative for agitation and confusion.   All other systems reviewed and are negative.   Physical Exam Updated Vital Signs BP 97/62 (BP Location: Right Arm)   Pulse 76   Temp 98.8 F (37.1 C) (Oral)   Resp 18   Ht '5\' 10"'$  (1.778 m)   Wt 66 kg   SpO2 100%   BMI 20.88 kg/m  Physical Exam Vitals and nursing note reviewed.  Constitutional:      General: He is not in acute distress.    Appearance: He is well-developed. He is not ill-appearing, toxic-appearing or diaphoretic.  HENT:     Head:     Jaw: Trismus, tenderness and swelling present.     Comments: Induration, erythema, tenderness, and swelling under his jaw in the submandibular area.  There is an area of purulent drainage coming out of a sore.  Also purulence seen in the mouth with extremely foul smell.  Patient has some trismus.    Nose: Nose normal.  Eyes:  Extraocular Movements: Extraocular movements intact.     Conjunctiva/sclera: Conjunctivae normal.     Pupils: Pupils are equal, round, and reactive to light.  Neck:     Vascular: No carotid bruit.  Cardiovascular:     Rate and Rhythm: Normal rate and regular rhythm.     Heart sounds: No murmur heard. Pulmonary:     Effort: Pulmonary effort is normal. No respiratory distress.     Breath sounds: Normal breath sounds. No wheezing, rhonchi or rales.  Chest:     Chest wall: No tenderness.  Abdominal:     Palpations: Abdomen is soft.     Tenderness: There is  no abdominal tenderness. There is no right CVA tenderness, left CVA tenderness, guarding or rebound.  Musculoskeletal:        General: Swelling and tenderness present.     Cervical back: Tenderness present.  Skin:    General: Skin is warm and dry.     Capillary Refill: Capillary refill takes less than 2 seconds.     Findings: Erythema present.  Neurological:     General: No focal deficit present.     Mental Status: He is alert.  Psychiatric:        Mood and Affect: Mood normal.            ED Results / Procedures / Treatments   Labs (all labs ordered are listed, but only abnormal results are displayed) Labs Reviewed  COMPREHENSIVE METABOLIC PANEL - Abnormal; Notable for the following components:      Result Value   Chloride 96 (*)    Glucose, Bld 147 (*)    Calcium 8.7 (*)    Albumin 2.4 (*)    AST 14 (*)    All other components within normal limits  CBC WITH DIFFERENTIAL/PLATELET - Abnormal; Notable for the following components:   WBC 11.3 (*)    RBC 2.94 (*)    Hemoglobin 8.8 (*)    HCT 27.1 (*)    Platelets 416 (*)    Neutro Abs 9.6 (*)    All other components within normal limits  PROTIME-INR - Abnormal; Notable for the following components:   Prothrombin Time 15.5 (*)    All other components within normal limits  CULTURE, BLOOD (ROUTINE X 2)  CULTURE, BLOOD (ROUTINE X 2)  LACTIC ACID, PLASMA  APTT  URINALYSIS, ROUTINE W REFLEX MICROSCOPIC    EKG EKG Interpretation  Date/Time:  Monday July 24 2021 16:26:11 EDT Ventricular Rate:  76 PR Interval:  130 QRS Duration: 94 QT Interval:  374 QTC Calculation: 420 R Axis:   80 Text Interpretation: Normal sinus rhythm Nonspecific ST abnormality Abnormal ECG When compared with ECG of 30-Aug-2020 12:34, PREVIOUS ECG IS PRESENT when compared to prior, similar appearance. No STEMI Confirmed by Antony Blackbird [phone]) on 07/24/2021 6:11:06 PM  Radiology CT Soft Tissue Neck W Contrast  Result Date:  07/24/2021 CLINICAL DATA:  History of glossopharyngeal cancer new draining abscess under chin EXAM: CT NECK WITH CONTRAST TECHNIQUE: Multidetector CT imaging of the neck was performed using the standard protocol following the bolus administration of intravenous contrast. RADIATION DOSE REDUCTION: This exam was performed according to the departmental dose-optimization program which includes automated exposure control, adjustment of the mA and/or kV according to patient size and/or use of iterative reconstruction technique. CONTRAST:  3m OMNIPAQUE IOHEXOL 300 MG/ML  SOLN COMPARISON:  07/04/2020 FINDINGS: Pharynx and larynx: Heterogeneous enhancement and necrosis in the tongue, eccentric to the right with the necrotic area  measuring approximately 4.9 x 3.2 x 2.7 cm (AP x TR x CC) (series 3, image 43 and series 6, image 45). Additional heterogeneous enhancement extends through the tongue musculature, with additional subcentimeter low-density collection along the right base of tongue. The overall area of heterogeneous enhancement measures approximately 8.3 x 6.1 x 6.1 cm (AP x TR x CC) (series 3, image 47 and series 6, image 40). Additional peripherally enhancing collection in the submental space, which measures approximately 3.0 x 2.0 x 2.5 cm (AP x TR x CC) (series 3, image 65 and series 6, image 49); this collection extends to the skin surface (series 6, image 51). Bubbly fluid in the hypopharynx which is favored to be intraluminal. Salivary glands: The submandibular glands are poorly defined given the surrounding enhancing tissue and may be involved with the heterogeneously enhancing mass. The parotid glands are also poorly evaluated. Thyroid: Enhancing lesion posterior to the right thyroid lobe (series 3, image 80), which measures up to 9 mm in the axial plane, possibly a parathyroid adenoma or exophytic portion of the right thyroid lobe. This appears grossly unchanged from 07/04/2020. Lymph nodes: Ill-defined  right level 2 lymph node measures up to 13 mm in short axis (series 3, image 47) additional ill-defined left level 2 lymph node measures up to 12 mm in short axis (series 3, image 42). Ill-defined left level 1B lymph node measures up to 7 mm in short axis (series 3, image 52). Ill-defined right level 1 B lymph node measures up to 11 mm in short axis (series 3, image 54). Vascular: Patent vasculature. Limited intracranial: Negative. Visualized orbits: Negative. Mastoids and visualized paranasal sinuses: Fluid in the right mastoid air cells. Otherwise clear. Skeleton: A lytic lesion is noted in the medial anterior mandible which measures up to 1.3 x 1.8 x 1.7 cm (AP x TR x CC) (series 3, image 55 and series 6, image 48) which is new from the prior exam. This involves the roots of the mandibular incisors. Cortical erosion is noted along the medial aspect of the right mandible in an area measuring up to 3.0 cm in AP dimension (series 3, image 49) involving the roots of the right mandibular molars. Additional lytic lesion involving the anterior right hyoid (series 3, image 55). Both lesions have irregular margins and are most concerning for osseous involvement with the tumor. Upper chest: No focal pulmonary opacity or pleural effusion. Other: None. IMPRESSION: Heterogeneous enhancement measuring up to 6.9 cm, with a 4.9 cm area of necrosis in the tongue, extending from the oral cavity through the tongue musculature and into the submental space, with osseous involvement in the mandible and hyoid. This is overall favored to represent tumor progression, although some of this may represent treatment related changes. An abscess in the submental space, which extends to the skin surface, is not clearly, directly connected to the tongue necrosis, but given the heterogeneously enhancing mass between them, these are felt to represent the same process. These results were called by telephone at the time of interpretation on  07/24/2021 at 8:09 pm to provider DR. Chrisandra Wiemers, who verbally acknowledged these results. Electronically Signed   By: Merilyn Baba M.D.   On: 07/24/2021 20:14   DG Chest Port 1 View  Result Date: 07/24/2021 CLINICAL DATA:  Concern for sepsis EXAM: PORTABLE CHEST 1 VIEW COMPARISON:  None Available. FINDINGS: The heart size and mediastinal contours are within normal limits. Both lungs are clear. The visualized skeletal structures are unremarkable. IMPRESSION: No active disease.  Electronically Signed   By: Yetta Glassman M.D.   On: 07/24/2021 18:28    Procedures Procedures   CRITICAL CARE Performed by: Gwenyth Allegra Basha Krygier Total critical care time: 60 minutes Critical care time was exclusive of separately billable procedures and treating other patients. Critical care was necessary to treat or prevent imminent or life-threatening deterioration. Critical care was time spent personally by me on the following activities: development of treatment plan with patient and/or surrogate as well as nursing, discussions with consultants, evaluation of patient's response to treatment, examination of patient, obtaining history from patient or surrogate, ordering and performing treatments and interventions, ordering and review of laboratory studies, ordering and review of radiographic studies, pulse oximetry and re-evaluation of patient's condition.   Medications Ordered in ED Medications  piperacillin-tazobactam (ZOSYN) IVPB 3.375 g (has no administration in time range)  iohexol (OMNIPAQUE) 300 MG/ML solution 75 mL (75 mLs Intravenous Contrast Given 07/24/21 1938)  piperacillin-tazobactam (ZOSYN) IVPB 3.375 g (0 g Intravenous Stopped 07/24/21 2155)    ED Course/ Medical Decision Making/ A&P                           Medical Decision Making Risk Prescription drug management.    Shariff Lasky is a 46 y.o. male with a past medical history significant for diabetes, and tongue cancer status post partial  glossectomy who presents with concern for neck infection.  According to patient, he has chronically had some pain in his bilateral jaw from his cancer and was started on fentanyl patches a week or 2 ago.  He says that over the last 4 to 5 days, he has developed redness, swelling, and pain under his jaw.  He is also had a foul taste with pus in his mouth and then today purulent started coming out of a wound on his neck.  He reports no fevers or chills and denies any chest pain or shortness of breath.  Reports some nausea but no vomiting.  Denies other complaints.  He gets his feeds through a feeding tube.  He reports that he normally has some difficulty opening his jaw but it has been slightly worse.  Patient was seen in triage and had work-up started to rule out neck infection.  On exam, patient does have significant swelling, tenderness, and hardness in his submandibular jaw with erythema and warmth.  There is also some purulence that is dripping onto his shirt from a small wound.  His mouth was opened and he does have a very foul smell with purulence all under his tongue.  He reports his trismus is slightly worse.  I do not hear stridor and his lungs were clear.  Chest and abdomen and back nontender otherwise.  Clinically I am very concerned about next infection or even Ludwig's angina.  Labs began to return from triage showing a leukocytosis of 11.3 and anemia of 8.8.  Metabolic panel shows normal kidney function.  INR 1.2.  Urinalysis clear.  Chest x-ray showed no active disease.  I personally viewed the CT scan and concerned about abscess and subcutaneous gas and infection of the neck.  I called radiology who is currently reading the images but feel he does have an infection in his neck.  We will order antibiotics and call Dr. Benjamine Mola who is the otolaryngologist who has been taking care of this patient.  8:18 PM I called Dr. Deeann Saint consulting number and spoke to his nurse.  They report that the  patient's care has been referred to [facility] to Dr. Janith Lima for further management of the glossal mass.  They requested we talk to [facility] for further management.  I told the patient this and patient says he does not want to go to [facility] night and wants to be taken care of here at [facility].  We will call the on-call ENT with Dr. Janace Hoard to discuss management here.  Anticipate admission to medicine service for antibiotics with ENT monitoring as well.  Spoke to Dr. Janace Hoard who feels this patient is too complex to be cared for here.  He feels patient needs to go to a tertiary care center and preferably [place] as this is where the patient has been seen before.  I spoke to patient and they agree with transfer.  We will call [facility] to discuss transfer for further management of jaw/neck infection.  9:52 PM Spoke to Dr. Margorie John with ENT and they accept the patient for ED to ED transfer for further evaluation and management of suspected necrotic neck tumor with abscess and infection.  I also spoke with Dr. Julien Nordmann in the emergency department who accept the patient in transfer.  We will try to arrange safe transport for the patient.  Given the patient's lack of hypoxia, shortness of breath, or difficulty breathing whatsoever, do not feel patient needs intubation at this time.  Vital signs are normal.  I told him that if infection or swelling was to worsen in his neck, he may end up needing intubation but does not appear to need at this time.  11:16 PM Patient was just reassessed and appears stable for transfer.  Still no airway complication at this time.  Do not feel he needs intubation or other airway management prior to transfer.  Patient be transferred for further management.        Final Clinical Impression(s) / ED Diagnoses Final diagnoses:  Neck abscess  Neck mass    Clinical Impression: 1. Neck abscess   2. Neck mass     Disposition: Patient will be transferred ED to ED for further  evaluation and management by ENT at [facility].  This note was prepared with assistance of Systems analyst. Occasional wrong-word or sound-a-like substitutions may have occurred due to the inherent limitations of voice recognition software.       Adryana Mogensen, Gwenyth Allegra, MD 07/24/21 [phone]

## 2021-07-24 NOTE — ED Notes (Signed)
[facility] present for pt transport to [facility]

## 2021-07-25 DIAGNOSIS — L0201 Cutaneous abscess of face: Secondary | ICD-10-CM | POA: Diagnosis not present

## 2021-07-25 DIAGNOSIS — K122 Cellulitis and abscess of mouth: Secondary | ICD-10-CM | POA: Diagnosis not present

## 2021-07-25 DIAGNOSIS — I10 Essential (primary) hypertension: Secondary | ICD-10-CM | POA: Diagnosis not present

## 2021-07-25 DIAGNOSIS — C028 Malignant neoplasm of overlapping sites of tongue: Secondary | ICD-10-CM | POA: Diagnosis not present

## 2021-07-25 DIAGNOSIS — Z79899 Other long term (current) drug therapy: Secondary | ICD-10-CM | POA: Diagnosis not present

## 2021-07-25 DIAGNOSIS — C029 Malignant neoplasm of tongue, unspecified: Secondary | ICD-10-CM | POA: Diagnosis not present

## 2021-07-25 DIAGNOSIS — C770 Secondary and unspecified malignant neoplasm of lymph nodes of head, face and neck: Secondary | ICD-10-CM | POA: Diagnosis not present

## 2021-07-25 DIAGNOSIS — K219 Gastro-esophageal reflux disease without esophagitis: Secondary | ICD-10-CM | POA: Diagnosis not present

## 2021-07-25 DIAGNOSIS — Z931 Gastrostomy status: Secondary | ICD-10-CM | POA: Diagnosis not present

## 2021-07-25 DIAGNOSIS — Z809 Family history of malignant neoplasm, unspecified: Secondary | ICD-10-CM | POA: Diagnosis not present

## 2021-07-25 DIAGNOSIS — L0211 Cutaneous abscess of neck: Secondary | ICD-10-CM | POA: Diagnosis not present

## 2021-07-25 DIAGNOSIS — Z794 Long term (current) use of insulin: Secondary | ICD-10-CM | POA: Diagnosis not present

## 2021-07-25 DIAGNOSIS — E119 Type 2 diabetes mellitus without complications: Secondary | ICD-10-CM | POA: Diagnosis not present

## 2021-07-25 DIAGNOSIS — K140 Glossitis: Secondary | ICD-10-CM | POA: Diagnosis not present

## 2021-07-25 DIAGNOSIS — E1039 Type 1 diabetes mellitus with other diabetic ophthalmic complication: Secondary | ICD-10-CM | POA: Diagnosis not present

## 2021-07-29 LAB — CULTURE, BLOOD (ROUTINE X 2): Culture: NO GROWTH

## 2021-08-16 DIAGNOSIS — C029 Malignant neoplasm of tongue, unspecified: Secondary | ICD-10-CM | POA: Diagnosis not present

## 2021-08-23 DIAGNOSIS — C029 Malignant neoplasm of tongue, unspecified: Secondary | ICD-10-CM | POA: Diagnosis not present

## 2021-08-24 DIAGNOSIS — C021 Malignant neoplasm of border of tongue: Secondary | ICD-10-CM | POA: Diagnosis not present

## 2021-08-29 DIAGNOSIS — C029 Malignant neoplasm of tongue, unspecified: Secondary | ICD-10-CM | POA: Diagnosis not present

## 2021-08-30 DIAGNOSIS — K148 Other diseases of tongue: Secondary | ICD-10-CM | POA: Diagnosis not present

## 2021-08-30 DIAGNOSIS — R1310 Dysphagia, unspecified: Secondary | ICD-10-CM | POA: Diagnosis not present

## 2021-08-30 DIAGNOSIS — C029 Malignant neoplasm of tongue, unspecified: Secondary | ICD-10-CM | POA: Diagnosis not present

## 2021-08-30 DIAGNOSIS — R590 Localized enlarged lymph nodes: Secondary | ICD-10-CM | POA: Diagnosis not present

## 2021-08-30 DIAGNOSIS — Z931 Gastrostomy status: Secondary | ICD-10-CM | POA: Diagnosis not present

## 2021-08-30 DIAGNOSIS — L0211 Cutaneous abscess of neck: Secondary | ICD-10-CM | POA: Diagnosis not present

## 2021-08-30 DIAGNOSIS — C069 Malignant neoplasm of mouth, unspecified: Secondary | ICD-10-CM | POA: Diagnosis not present

## 2021-08-30 DIAGNOSIS — Z9049 Acquired absence of other specified parts of digestive tract: Secondary | ICD-10-CM | POA: Diagnosis not present

## 2021-08-31 DIAGNOSIS — Z006 Encounter for examination for normal comparison and control in clinical research program: Secondary | ICD-10-CM | POA: Diagnosis not present

## 2021-08-31 DIAGNOSIS — C029 Malignant neoplasm of tongue, unspecified: Secondary | ICD-10-CM | POA: Diagnosis not present

## 2021-08-31 DIAGNOSIS — Z5112 Encounter for antineoplastic immunotherapy: Secondary | ICD-10-CM | POA: Diagnosis not present

## 2021-09-18 DIAGNOSIS — Z781 Physical restraint status: Secondary | ICD-10-CM | POA: Diagnosis not present

## 2021-09-18 DIAGNOSIS — Z930 Tracheostomy status: Secondary | ICD-10-CM | POA: Diagnosis not present

## 2021-09-18 DIAGNOSIS — Z006 Encounter for examination for normal comparison and control in clinical research program: Secondary | ICD-10-CM | POA: Diagnosis not present

## 2021-09-18 DIAGNOSIS — D490 Neoplasm of unspecified behavior of digestive system: Secondary | ICD-10-CM | POA: Diagnosis not present

## 2021-09-18 DIAGNOSIS — K5900 Constipation, unspecified: Secondary | ICD-10-CM | POA: Diagnosis not present

## 2021-09-18 DIAGNOSIS — R0689 Other abnormalities of breathing: Secondary | ICD-10-CM | POA: Diagnosis not present

## 2021-09-18 DIAGNOSIS — E871 Hypo-osmolality and hyponatremia: Secondary | ICD-10-CM | POA: Diagnosis not present

## 2021-09-18 DIAGNOSIS — D500 Iron deficiency anemia secondary to blood loss (chronic): Secondary | ICD-10-CM | POA: Diagnosis not present

## 2021-09-18 DIAGNOSIS — Z931 Gastrostomy status: Secondary | ICD-10-CM | POA: Diagnosis not present

## 2021-09-18 DIAGNOSIS — D62 Acute posthemorrhagic anemia: Secondary | ICD-10-CM | POA: Diagnosis not present

## 2021-09-18 DIAGNOSIS — D638 Anemia in other chronic diseases classified elsewhere: Secondary | ICD-10-CM | POA: Diagnosis not present

## 2021-09-18 DIAGNOSIS — E1065 Type 1 diabetes mellitus with hyperglycemia: Secondary | ICD-10-CM | POA: Diagnosis not present

## 2021-09-18 DIAGNOSIS — I959 Hypotension, unspecified: Secondary | ICD-10-CM | POA: Diagnosis not present

## 2021-09-18 DIAGNOSIS — R252 Cramp and spasm: Secondary | ICD-10-CM | POA: Diagnosis not present

## 2021-09-18 DIAGNOSIS — R041 Hemorrhage from throat: Secondary | ICD-10-CM | POA: Diagnosis not present

## 2021-09-18 DIAGNOSIS — E873 Alkalosis: Secondary | ICD-10-CM | POA: Diagnosis not present

## 2021-09-18 DIAGNOSIS — R58 Hemorrhage, not elsewhere classified: Secondary | ICD-10-CM | POA: Diagnosis not present

## 2021-09-18 DIAGNOSIS — K1379 Other lesions of oral mucosa: Secondary | ICD-10-CM | POA: Diagnosis not present

## 2021-09-18 DIAGNOSIS — I10 Essential (primary) hypertension: Secondary | ICD-10-CM | POA: Diagnosis not present

## 2021-09-18 DIAGNOSIS — D649 Anemia, unspecified: Secondary | ICD-10-CM | POA: Diagnosis not present

## 2021-09-18 DIAGNOSIS — C069 Malignant neoplasm of mouth, unspecified: Secondary | ICD-10-CM | POA: Diagnosis not present

## 2021-09-18 DIAGNOSIS — R1310 Dysphagia, unspecified: Secondary | ICD-10-CM | POA: Diagnosis not present

## 2021-09-18 DIAGNOSIS — Z794 Long term (current) use of insulin: Secondary | ICD-10-CM | POA: Diagnosis not present

## 2021-09-18 DIAGNOSIS — E109 Type 1 diabetes mellitus without complications: Secondary | ICD-10-CM | POA: Diagnosis not present

## 2021-09-18 DIAGNOSIS — R1319 Other dysphagia: Secondary | ICD-10-CM | POA: Diagnosis not present

## 2021-09-18 DIAGNOSIS — C7951 Secondary malignant neoplasm of bone: Secondary | ICD-10-CM | POA: Diagnosis not present

## 2021-09-18 DIAGNOSIS — Z510 Encounter for antineoplastic radiation therapy: Secondary | ICD-10-CM | POA: Diagnosis not present

## 2021-09-18 DIAGNOSIS — K148 Other diseases of tongue: Secondary | ICD-10-CM | POA: Diagnosis not present

## 2021-09-18 DIAGNOSIS — C01 Malignant neoplasm of base of tongue: Secondary | ICD-10-CM | POA: Diagnosis not present

## 2021-09-18 DIAGNOSIS — E119 Type 2 diabetes mellitus without complications: Secondary | ICD-10-CM | POA: Diagnosis not present

## 2021-09-18 DIAGNOSIS — E10649 Type 1 diabetes mellitus with hypoglycemia without coma: Secondary | ICD-10-CM | POA: Diagnosis not present

## 2021-09-18 DIAGNOSIS — C029 Malignant neoplasm of tongue, unspecified: Secondary | ICD-10-CM | POA: Diagnosis not present

## 2021-09-18 DIAGNOSIS — C770 Secondary and unspecified malignant neoplasm of lymph nodes of head, face and neck: Secondary | ICD-10-CM | POA: Diagnosis not present

## 2021-09-21 DIAGNOSIS — C029 Malignant neoplasm of tongue, unspecified: Secondary | ICD-10-CM | POA: Diagnosis not present

## 2021-09-21 DIAGNOSIS — Z510 Encounter for antineoplastic radiation therapy: Secondary | ICD-10-CM | POA: Diagnosis not present

## 2021-09-25 DIAGNOSIS — Z930 Tracheostomy status: Secondary | ICD-10-CM | POA: Diagnosis not present

## 2021-09-25 DIAGNOSIS — Z430 Encounter for attention to tracheostomy: Secondary | ICD-10-CM | POA: Diagnosis not present

## 2021-09-25 DIAGNOSIS — C029 Malignant neoplasm of tongue, unspecified: Secondary | ICD-10-CM | POA: Diagnosis not present

## 2021-09-25 DIAGNOSIS — Z510 Encounter for antineoplastic radiation therapy: Secondary | ICD-10-CM | POA: Diagnosis not present

## 2021-09-26 DIAGNOSIS — Z510 Encounter for antineoplastic radiation therapy: Secondary | ICD-10-CM | POA: Diagnosis not present

## 2021-09-26 DIAGNOSIS — C029 Malignant neoplasm of tongue, unspecified: Secondary | ICD-10-CM | POA: Diagnosis not present

## 2021-09-28 DIAGNOSIS — Z931 Gastrostomy status: Secondary | ICD-10-CM | POA: Diagnosis not present

## 2021-09-28 DIAGNOSIS — Z930 Tracheostomy status: Secondary | ICD-10-CM | POA: Diagnosis not present

## 2021-09-28 DIAGNOSIS — C029 Malignant neoplasm of tongue, unspecified: Secondary | ICD-10-CM | POA: Diagnosis not present

## 2021-09-28 DIAGNOSIS — Z006 Encounter for examination for normal comparison and control in clinical research program: Secondary | ICD-10-CM | POA: Diagnosis not present

## 2021-09-28 DIAGNOSIS — R1319 Other dysphagia: Secondary | ICD-10-CM | POA: Diagnosis not present

## 2021-10-04 ENCOUNTER — Ambulatory Visit: Payer: Self-pay

## 2021-10-04 DIAGNOSIS — C029 Malignant neoplasm of tongue, unspecified: Secondary | ICD-10-CM | POA: Diagnosis not present

## 2021-10-04 DIAGNOSIS — Z510 Encounter for antineoplastic radiation therapy: Secondary | ICD-10-CM | POA: Diagnosis not present

## 2021-10-04 NOTE — Telephone Encounter (Signed)
  Chief Complaint: advice Symptoms: needing adapter for suction machine Frequency: today Pertinent Negatives: NA Disposition: '[]'$ ED /'[]'$ Urgent Care (no appt availability in office) / '[]'$ Appointment(In office/virtual)/ '[]'$  [facility]/ '[]'$ Home Care/ '[]'$ Refused Recommended Disposition /'[]'$ [facility]/ '[]'$  Follow-up with PCP Additional Notes: pt's mother calling to see what she can do about pt not having power source to suction machine. It was left in [place] where they went this weekend and getting overnighted but unsure when that will be there. I called a medical supply store in [place] but they dont have suctions equipment in store. She advised to call [employer]. I informed mother of this and advised her to call [employer] and gave #. Also recommended if she wasn't able to get anything tonight could go to ED or call 911 and they should be able to suction pt. She states that pt is requiring suction 2-3 hrs since it is a new tracheostomy. Mother states she would call [employer] and go from there.

## 2021-10-05 DIAGNOSIS — Z430 Encounter for attention to tracheostomy: Secondary | ICD-10-CM | POA: Diagnosis not present

## 2021-10-05 DIAGNOSIS — C029 Malignant neoplasm of tongue, unspecified: Secondary | ICD-10-CM | POA: Diagnosis not present

## 2021-10-05 DIAGNOSIS — Z510 Encounter for antineoplastic radiation therapy: Secondary | ICD-10-CM | POA: Diagnosis not present

## 2021-10-05 DIAGNOSIS — Z930 Tracheostomy status: Secondary | ICD-10-CM | POA: Diagnosis not present

## 2021-10-11 DIAGNOSIS — C029 Malignant neoplasm of tongue, unspecified: Secondary | ICD-10-CM | POA: Diagnosis not present

## 2021-10-11 DIAGNOSIS — Z430 Encounter for attention to tracheostomy: Secondary | ICD-10-CM | POA: Diagnosis not present

## 2021-10-11 DIAGNOSIS — Z79620 Long term (current) use of immunosuppressive biologic: Secondary | ICD-10-CM | POA: Diagnosis not present

## 2021-10-11 DIAGNOSIS — R7989 Other specified abnormal findings of blood chemistry: Secondary | ICD-10-CM | POA: Diagnosis not present

## 2021-10-11 DIAGNOSIS — Z930 Tracheostomy status: Secondary | ICD-10-CM | POA: Diagnosis not present

## 2021-10-11 DIAGNOSIS — R601 Generalized edema: Secondary | ICD-10-CM | POA: Diagnosis not present

## 2021-10-11 DIAGNOSIS — R600 Localized edema: Secondary | ICD-10-CM | POA: Diagnosis not present

## 2021-10-18 DIAGNOSIS — R918 Other nonspecific abnormal finding of lung field: Secondary | ICD-10-CM | POA: Diagnosis not present

## 2021-10-18 DIAGNOSIS — C029 Malignant neoplasm of tongue, unspecified: Secondary | ICD-10-CM | POA: Diagnosis not present

## 2021-10-19 DIAGNOSIS — R609 Edema, unspecified: Secondary | ICD-10-CM | POA: Diagnosis not present

## 2021-10-19 DIAGNOSIS — Z2989 Encounter for other specified prophylactic measures: Secondary | ICD-10-CM | POA: Diagnosis not present

## 2021-10-19 DIAGNOSIS — Z006 Encounter for examination for normal comparison and control in clinical research program: Secondary | ICD-10-CM | POA: Diagnosis not present

## 2021-10-19 DIAGNOSIS — Z79620 Long term (current) use of immunosuppressive biologic: Secondary | ICD-10-CM | POA: Diagnosis not present

## 2021-10-19 DIAGNOSIS — C029 Malignant neoplasm of tongue, unspecified: Secondary | ICD-10-CM | POA: Diagnosis not present

## 2021-10-19 DIAGNOSIS — R600 Localized edema: Secondary | ICD-10-CM | POA: Diagnosis not present

## 2021-10-19 DIAGNOSIS — Z931 Gastrostomy status: Secondary | ICD-10-CM | POA: Diagnosis not present

## 2021-10-19 DIAGNOSIS — E119 Type 2 diabetes mellitus without complications: Secondary | ICD-10-CM | POA: Diagnosis not present

## 2021-10-19 DIAGNOSIS — Z79899 Other long term (current) drug therapy: Secondary | ICD-10-CM | POA: Diagnosis not present

## 2021-10-20 ENCOUNTER — Encounter (HOSPITAL_COMMUNITY): Payer: Self-pay | Admitting: Emergency Medicine

## 2021-10-20 ENCOUNTER — Emergency Department (HOSPITAL_COMMUNITY)
Admission: EM | Admit: 2021-10-20 | Discharge: 2021-10-20 | Discharge status: Against Medical Advice | Payer: 59 | Attending: Student | Admitting: Student

## 2021-10-20 ENCOUNTER — Other Ambulatory Visit: Payer: Self-pay

## 2021-10-20 ENCOUNTER — Emergency Department (HOSPITAL_COMMUNITY): Payer: 59

## 2021-10-20 DIAGNOSIS — L97509 Non-pressure chronic ulcer of other part of unspecified foot with unspecified severity: Secondary | ICD-10-CM | POA: Diagnosis not present

## 2021-10-20 DIAGNOSIS — E11621 Type 2 diabetes mellitus with foot ulcer: Secondary | ICD-10-CM | POA: Diagnosis not present

## 2021-10-20 DIAGNOSIS — M79672 Pain in left foot: Secondary | ICD-10-CM | POA: Diagnosis not present

## 2021-10-20 DIAGNOSIS — M79671 Pain in right foot: Secondary | ICD-10-CM | POA: Insufficient documentation

## 2021-10-20 DIAGNOSIS — L97529 Non-pressure chronic ulcer of other part of left foot with unspecified severity: Secondary | ICD-10-CM | POA: Diagnosis not present

## 2021-10-20 DIAGNOSIS — Z5321 Procedure and treatment not carried out due to patient leaving prior to being seen by health care provider: Secondary | ICD-10-CM | POA: Insufficient documentation

## 2021-10-20 DIAGNOSIS — L03119 Cellulitis of unspecified part of limb: Secondary | ICD-10-CM | POA: Diagnosis not present

## 2021-10-20 DIAGNOSIS — L97519 Non-pressure chronic ulcer of other part of right foot with unspecified severity: Secondary | ICD-10-CM | POA: Diagnosis not present

## 2021-10-20 DIAGNOSIS — M19072 Primary osteoarthritis, left ankle and foot: Secondary | ICD-10-CM | POA: Diagnosis not present

## 2021-10-20 DIAGNOSIS — E13621 Other specified diabetes mellitus with foot ulcer: Secondary | ICD-10-CM | POA: Diagnosis not present

## 2021-10-20 DIAGNOSIS — S91302A Unspecified open wound, left foot, initial encounter: Secondary | ICD-10-CM | POA: Diagnosis not present

## 2021-10-20 DIAGNOSIS — M7989 Other specified soft tissue disorders: Secondary | ICD-10-CM | POA: Diagnosis not present

## 2021-10-20 NOTE — ED Provider Triage Note (Signed)
Emergency Medicine Provider Triage Evaluation Note  Daryon Remmert , a 46 y.o. male  was evaluated in triage.  Pt complains of infection to his feet bilaterally, note that he has a wound on both his feet came on after he has surgery recently had a trach placed in, endorsing significant pain no fevers or chills..  Review of Systems  Positive: Feet pain, wound Negative: Chest pain, shortness of breath  Physical Exam  BP 100/69   Pulse 86   Temp 97.6 F (36.4 C)   Resp 18   SpO2 99%  Gen:   Awake, no distress   Resp:  Normal effort  MSK:   Moves extremities without difficulty  Other:    Medical Decision Making  Medically screening exam initiated at 9:16 PM.  Appropriate orders placed.  Earon Rivest was informed that the remainder of the evaluation will be completed by another provider, this initial triage assessment does not replace that evaluation, and the importance of remaining in the ED until their evaluation is complete.  Lab work imaging been ordered will need further work-up.   Marcello Fennel, PA-C 10/20/21 2117

## 2021-10-20 NOTE — ED Notes (Signed)
Pt stated that he is going to [facility] with Pt's PCP. Pt was seen leaving the ED with family.

## 2021-10-20 NOTE — ED Triage Notes (Signed)
Patient's mother reports worsening wound infections at bilateral feet for several weeks with swelling and malodorous drainage . Denies fever or chills .

## 2021-10-23 DIAGNOSIS — Z930 Tracheostomy status: Secondary | ICD-10-CM | POA: Diagnosis not present

## 2021-10-23 DIAGNOSIS — L0211 Cutaneous abscess of neck: Secondary | ICD-10-CM | POA: Diagnosis not present

## 2021-10-23 DIAGNOSIS — C029 Malignant neoplasm of tongue, unspecified: Secondary | ICD-10-CM | POA: Diagnosis not present

## 2021-10-23 DIAGNOSIS — Z430 Encounter for attention to tracheostomy: Secondary | ICD-10-CM | POA: Diagnosis not present

## 2021-10-24 DIAGNOSIS — R601 Generalized edema: Secondary | ICD-10-CM | POA: Diagnosis not present

## 2021-10-25 DIAGNOSIS — C029 Malignant neoplasm of tongue, unspecified: Secondary | ICD-10-CM | POA: Diagnosis not present

## 2021-10-25 DIAGNOSIS — Z930 Tracheostomy status: Secondary | ICD-10-CM | POA: Diagnosis not present

## 2021-10-25 DIAGNOSIS — Z430 Encounter for attention to tracheostomy: Secondary | ICD-10-CM | POA: Diagnosis not present

## 2021-10-25 DIAGNOSIS — G893 Neoplasm related pain (acute) (chronic): Secondary | ICD-10-CM | POA: Diagnosis not present

## 2021-10-25 DIAGNOSIS — Z515 Encounter for palliative care: Secondary | ICD-10-CM | POA: Diagnosis not present

## 2021-11-01 ENCOUNTER — Encounter (HOSPITAL_BASED_OUTPATIENT_CLINIC_OR_DEPARTMENT_OTHER): Payer: 59 | Attending: Physician Assistant | Admitting: Physician Assistant

## 2021-11-01 DIAGNOSIS — Z931 Gastrostomy status: Secondary | ICD-10-CM | POA: Diagnosis not present

## 2021-11-01 DIAGNOSIS — L97522 Non-pressure chronic ulcer of other part of left foot with fat layer exposed: Secondary | ICD-10-CM | POA: Diagnosis not present

## 2021-11-01 DIAGNOSIS — E10621 Type 1 diabetes mellitus with foot ulcer: Secondary | ICD-10-CM | POA: Insufficient documentation

## 2021-11-01 DIAGNOSIS — M6281 Muscle weakness (generalized): Secondary | ICD-10-CM | POA: Diagnosis not present

## 2021-11-01 DIAGNOSIS — L97518 Non-pressure chronic ulcer of other part of right foot with other specified severity: Secondary | ICD-10-CM | POA: Insufficient documentation

## 2021-11-01 DIAGNOSIS — L89153 Pressure ulcer of sacral region, stage 3: Secondary | ICD-10-CM | POA: Diagnosis not present

## 2021-11-02 NOTE — Progress Notes (Addendum)
Shawn Rhodes ([id number]) [id number] Page 1 of 15 Visit Report for 11/01/2021 Chief Complaint Document Details Patient Name: Date of Service: Shawn Rhodes, Shawn Rhodes [place] 11/01/2021 1:15 PM Medical Record Number: [id number] Patient Account Number: [id number] Date of Birth/Sex: Treating RN: [birth year] (46 y.o. M) Primary Care Provider: Shirline Frees Other Clinician: Referring Provider: Treating Provider/Extender: Vance Peper in Treatment: 0 Information Obtained from: Patient Chief Complaint Bilateral foot ulcers and sacral ulcer Electronic Signature(s) Signed: 11/01/2021 3:35:16 PM By: Worthy Keeler PA-C Entered By: Worthy Keeler on 11/01/2021 15:35:16 -------------------------------------------------------------------------------- Debridement Details Patient Name: Date of Service: Shawn Rhodes, Shawn Rhodes [place] 11/01/2021 1:15 PM Medical Record Number: [id number] Patient Account Number: [id number] Date of Birth/Sex: Treating RN: 03-Feb-1975 (46 y.o. [name] Primary Care Provider: Shirline Frees Other Clinician: Referring Provider: Treating Provider/Extender: Vance Peper in Treatment: 0 Debridement Performed for Assessment: Wound #2 Left,Distal,Plantar Foot Performed By: Physician Worthy Keeler, PA Debridement Type: Debridement Severity of Tissue Pre Debridement: Fat layer exposed Level of Consciousness (Pre-procedure): Awake and Alert Pre-procedure Verification/Time Out Yes - 15:50 Taken: Start Time: 15:50 Pain Control: Lidocaine 5% topical ointment T Area Debrided (L x W): otal 2.4 (cm) x 2.4 (cm) = 5.76 (cm) Tissue and other material debrided: Non-Viable, Eschar Level: Non-Viable Tissue Debridement Description: Selective/Open Wound Instrument: Forceps, Scissors Bleeding: Minimum Hemostasis Achieved: Pressure Procedural Pain: 0 Post Procedural Pain: 0 Response to Treatment: Procedure was  tolerated well Level of Consciousness (Post- Awake and Alert procedure): Post Debridement Measurements of Total Wound Length: (cm) 2.4 Width: (cm) 2.4 Depth: (cm) 0.3 Volume: (cm) 1.357 Character of Wound/Ulcer Post Debridement: Improved Severity of Tissue Post Debridement: Fat layer exposed Shawn Rhodes ([id number]) [id number] Page 2 of 15 Post Procedure Diagnosis Same as Pre-procedure Notes Scribed for Jeri Cos, PA by Shawn Creamer, RN Electronic Signature(s) Signed: 11/01/2021 5:59:56 PM By: Shawn Creamer RN, BSN Signed: 11/01/2021 6:42:42 PM By: Worthy Keeler PA-C Entered By: Shawn Rhodes on 11/01/2021 15:57:18 -------------------------------------------------------------------------------- Debridement Details Patient Name: Date of Service: Shawn Rhodes, Shawn Rhodes [place] 11/01/2021 1:15 PM Medical Record Number: [id number] Patient Account Number: [id number] Date of Birth/Sex: Treating RN: 07/04/1975 (46 y.o. [name] Primary Care Provider: Shirline Frees Other Clinician: Referring Provider: Treating Provider/Extender: Vance Peper in Treatment: 0 Debridement Performed for Assessment: Wound #4 Left T - Web between 4th and 5th oe Performed By: Physician Worthy Keeler, PA Debridement Type: Debridement Severity of Tissue Pre Debridement: Fat layer exposed Level of Consciousness (Pre-procedure): Awake and Alert Pre-procedure Verification/Time Out Yes - 15:50 Taken: Start Time: 15:50 Pain Control: Lidocaine 5% topical ointment T Area Debrided (L x W): otal 3.2 (cm) x 0.7 (cm) = 2.24 (cm) Tissue and other material debrided: Non-Viable, Eschar, Muscle, Slough, Skin: Dermis , Skin: Epidermis, Slough Level: Skin/Subcutaneous Tissue/Muscle Debridement Description: Excisional Instrument: Forceps, Scissors Bleeding: Minimum Hemostasis Achieved: Pressure Procedural Pain: 0 Post Procedural Pain: 0 Response to  Treatment: Procedure was tolerated well Level of Consciousness (Post- Awake and Alert procedure): Post Debridement Measurements of Total Wound Length: (cm) 3.2 Width: (cm) 0.7 Depth: (cm) 0.2 Volume: (cm) 0.352 Character of Wound/Ulcer Post Debridement: Improved Severity of Tissue Post Debridement: Fat layer exposed Post Procedure Diagnosis Same as Pre-procedure Notes Scribed for Jeri Cos, PA by Shawn Creamer, RN Electronic Signature(s) Signed: 11/01/2021 5:59:56 PM By: Shawn Creamer RN, BSN Signed: 11/01/2021 6:42:42 PM By: Worthy Keeler PA-C Entered By: Shawn Rhodes on 11/01/2021 15:59:44 Shawn Rhodes ([id number]) [id number] Page 3 of 15 --------------------------------------------------------------------------------  Debridement Details Patient Name: Date of Service: Shawn Rhodes, Shawn Rhodes [place] 11/01/2021 1:15 PM Medical Record Number: [id number] Patient Account Number: [id number] Date of Birth/Sex: Treating RN: 04/26/1975 (46 y.o. [name] Primary Care Provider: Shirline Frees Other Clinician: Referring Provider: Treating Provider/Extender: Vance Peper in Treatment: 0 Debridement Performed for Assessment: Wound #3 Left,Medial,Plantar Foot Performed By: Physician Worthy Keeler, PA Debridement Type: Debridement Severity of Tissue Pre Debridement: Fat layer exposed Level of Consciousness (Pre-procedure): Awake and Alert Pre-procedure Verification/Time Out Yes - 15:50 Taken: Start Time: 15:50 Pain Control: Lidocaine 5% topical ointment T Area Debrided (L x W): otal 4 (cm) x 3.3 (cm) = 13.2 (cm) Tissue and other material debrided: Non-Viable, Eschar, Muscle, Slough, Skin: Dermis , Skin: Epidermis, Slough Level: Skin/Subcutaneous Tissue/Muscle Debridement Description: Excisional Instrument: Forceps, Scissors Specimen: Tissue Culture Number of Specimens T aken: 1 Bleeding: Minimum Hemostasis Achieved:  Pressure Procedural Pain: 0 Post Procedural Pain: 0 Response to Treatment: Procedure was tolerated well Level of Consciousness (Post- Awake and Alert procedure): Post Debridement Measurements of Total Wound Length: (cm) 4 Width: (cm) 3.3 Depth: (cm) 0.3 Volume: (cm) 3.11 Character of Wound/Ulcer Post Debridement: Improved Severity of Tissue Post Debridement: Necrosis of muscle Post Procedure Diagnosis Same as Pre-procedure Notes scribed for Jeri Cos, PA by Shawn Creamer, RN Electronic Signature(s) Signed: 11/01/2021 5:59:56 PM By: Shawn Creamer RN, BSN Signed: 11/01/2021 6:42:42 PM By: Worthy Keeler PA-C Entered By: Shawn Rhodes on 11/01/2021 17:40:51 -------------------------------------------------------------------------------- HPI Details Patient Name: Date of Service: Shawn Rhodes, Shawn Rhodes [place] 11/01/2021 1:15 PM Medical Record Number: [id number] Patient Account Number: [id number] Date of Birth/Sex: Treating RN: 04-07-1975 (46 y.o. M) Primary Care Provider: Shirline Frees Other Clinician: Referring Provider: Treating Provider/Extender: Vance Peper in Treatment: 0 [place], Lennette Bihari ([id number]) [id number] Page 4 of 15 History of Present Illness HPI Description: 11-01-2021 patient presents today for initial evaluation here in our clinic concerning issues that he has been having with multiple wounds over the left foot and right foot as well as the sacral area at this point. The patient does have a history of diabetes mellitus type 1 which is probably contributing to some degree with what we are seeing here. He also has a pressure ulcer on the sacral region unfortunately that I think is more due to his generalized weakness. He does have a G-tube. He really is not in a very healthy state he is seen with his sister as well as his mother today. They are actually contemplating going into hospice care and that is part of the  conversation to be had as well how aggressive we go to be with any wound care measures at this point. I did discuss with him in detail some of what we were looking at here in as far as treatment options what types of things we had available to [place] at this point. Electronic Signature(s) Signed: 11/07/2021 4:58:30 PM By: Worthy Keeler PA-C Entered By: Worthy Keeler on 11/07/2021 16:58:30 -------------------------------------------------------------------------------- Physical Exam Details Patient Name: Date of Service: Shawn Rhodes, Shawn Rhodes [place] 11/01/2021 1:15 PM Medical Record Number: [id number] Patient Account Number: [id number] Date of Birth/Sex: Treating RN: Jul 23, 1975 (46 y.o. M) Primary Care Provider: Shirline Frees Other Clinician: Referring Provider: Treating Provider/Extender: Vance Peper in Treatment: 0 Constitutional patient is hypotensive.. pulse regular and within target range for patient.Marland Kitchen respirations regular, non-labored and within target range for patient.Marland Kitchen temperature within target range for patient.. Well-nourished and well-hydrated in no acute distress. Eyes conjunctiva  clear no eyelid edema noted. pupils equal round and reactive to light and accommodation. Ears, Nose, Mouth, and Throat no gross abnormality of ear auricles or external auditory canals. normal hearing noted during conversation. mucus membranes moist. Respiratory normal breathing without difficulty. clear to auscultation bilaterally. Musculoskeletal Patient unable to walk without assistance. Psychiatric this patient is able to make decisions and demonstrates good insight into disease process. Alert and Oriented x 3. pleasant and cooperative. Notes Upon inspection patient has several areas of significant eschar noted over the feet there is really quite a lot going on at this point. Fortunately I do not see any signs of systemic infection necessarily but I do see a lot of of evidence  that there could be some local infection of which we will get a go ahead and check into doing a culture to try to see what is being shown here. Fortunately I do think I can clearway some of the necrotic tissue today and that was performed with scissors and forceps carefully today. There is also some question of how good his blood flow is arterially into the extremity. His blood pressure today is 95/60. Electronic Signature(s) Signed: 11/07/2021 5:00:18 PM By: Worthy Keeler PA-C Entered By: Worthy Keeler on 11/07/2021 17:00:18 -------------------------------------------------------------------------------- Physician Orders Details Patient Name: Date of Service: Shawn Rhodes, Shawn Rhodes [place] 11/01/2021 1:15 PM Medical Record Number: [id number] Patient Account Number: [id number] Date of Birth/Sex: Treating RN: 07-13-1975 (46 y.o. [name] Primary Care Provider: Shirline Frees Other Clinician: Lorette Rhodes ([id number]) [id number] Page 5 of 15 Referring Provider: Treating Provider/Extender: Vance Peper in Treatment: 0 Verbal / Phone Orders: No Diagnosis Coding ICD-10 Coding Code Description E10.621 Type 1 diabetes mellitus with foot ulcer L97.522 Non-pressure chronic ulcer of other part of left foot with fat layer exposed L97.518 Non-pressure chronic ulcer of other part of right foot with other specified severity L89.153 Pressure ulcer of sacral region, stage 3 Z93.1 Gastrostomy status M62.81 Muscle weakness (generalized) Follow-up Appointments ppointment in 2 weeks. - with Jeri Cos, PA Return A Anesthetic Wound #1 Right,Lateral,Plantar Foot (In clinic) Topical Lidocaine 5% applied to wound bed Wound #2 Left,Distal,Plantar Foot (In clinic) Topical Lidocaine 5% applied to wound bed Wound #3 Left,Medial,Plantar Foot (In clinic) Topical Lidocaine 5% applied to wound bed Wound #4 Left T - Web between 4th and 5th oe (In clinic)  Topical Lidocaine 5% applied to wound bed Wound #5 Sacrum (In clinic) Topical Lidocaine 5% applied to wound bed Bathing/ Shower/ Hygiene May shower and wash wound with soap and water. Off-Loading Wound #1 Right,Lateral,Plantar Foot Gel wheelchair cushion Turn and reposition every 2 hours Prevalon Boot Wound #2 Left,Distal,Plantar Foot Gel wheelchair cushion Turn and reposition every 2 hours Prevalon Boot Wound #3 Left,Medial,Plantar Foot Gel wheelchair cushion Turn and reposition every 2 hours Prevalon Boot Wound #4 Left T - Web between 4th and 5th oe Gel wheelchair cushion Turn and reposition every 2 hours Prevalon Boot Wound #5 Sacrum Gel wheelchair cushion Turn and reposition every 2 hours Prevalon Boot Wound Treatment Wound #1 - Foot Wound Laterality: Plantar, Right, Lateral Cleanser: Soap and Water 1 x Per Day/30 Days Discharge Instructions: May shower and wash wound with dial antibacterial soap and water prior to dressing change. Topical: Povidone-Iodine 10% PVP Swabstick, 1(swab) (DME) (Generic) 1 x Per Day/30 Days Secondary Dressing: Woven Gauze Sponge, Non-Sterile 4x4 in (DME) (Generic) 1 x Per Day/30 Days Discharge Instructions: Apply over primary dressing as directed. Secured With: [place]  Surgical T ape, 4 x 10 (in/yd) (DME) (Generic) 1 x Per Day/30 Days Discharge Instructions: Secure with tape as directed. Wound #2 - Foot Wound Laterality: Plantar, Left, Distal Cleanser: Wound Cleanser 1 x Per Day/30 Days Shawn Rhodes, Shawn Rhodes ([id number]) [id number] Page 6 of 15 Discharge Instructions: Cleanse the wound with wound cleanser prior to applying a clean dressing using gauze sponges, not tissue or cotton balls. Prim Dressing: Dakin's Solution 0.25%, 16 (oz) 1 x Per Day/30 Days ary Discharge Instructions: Moisten gauze with Dakin's solution Secondary Dressing: ABD Pad, 5x9 (DME) (Generic) 1 x Per Day/30 Days Discharge  Instructions: Apply over primary dressing as directed. Secondary Dressing: Woven Gauze Sponge, Non-Sterile 4x4 in (DME) (Generic) 1 x Per Day/30 Days Discharge Instructions: Apply over primary dressing as directed. Secondary Dressing: Zetuvit Plus 4x8 in (DME) (Generic) 1 x Per Day/30 Days Discharge Instructions: Apply over primary dressing as directed. Secured With: Elastic Bandage 4 inch (ACE bandage) (DME) (Generic) 1 x Per Day/30 Days Discharge Instructions: Secure with ACE bandage as directed. Secured With: [employer], 4.5x3.1 (in/yd) (DME) (Generic) 1 x Per Day/30 Days Discharge Instructions: Secure with Kerlix as directed. Secured With: 50M Medipore H Soft Cloth Surgical T ape, 4 x 10 (in/yd) (DME) (Generic) 1 x Per Day/30 Days Discharge Instructions: Secure with tape as directed. Wound #3 - Foot Wound Laterality: Plantar, Left, Medial Cleanser: Wound Cleanser 1 x Per Day/30 Days Discharge Instructions: Cleanse the wound with wound cleanser prior to applying a clean dressing using gauze sponges, not tissue or cotton balls. Prim Dressing: Dakin's Solution 0.25%, 16 (oz) 1 x Per Day/30 Days ary Discharge Instructions: Moisten gauze with Dakin's solution Secondary Dressing: ABD Pad, 5x9 1 x Per Day/30 Days Discharge Instructions: Apply over primary dressing as directed. Secondary Dressing: Woven Gauze Sponge, Non-Sterile 4x4 in 1 x Per Day/30 Days Discharge Instructions: Apply over primary dressing as directed. Secondary Dressing: Zetuvit Plus 4x8 in 1 x Per Day/30 Days Discharge Instructions: Apply over primary dressing as directed. Secured With: Elastic Bandage 4 inch (ACE bandage) 1 x Per Day/30 Days Discharge Instructions: Secure with ACE bandage as directed. Secured With: [employer], 4.5x3.1 (in/yd) 1 x Per Day/30 Days Discharge Instructions: Secure with Kerlix as directed. Secured With: 50M Medipore H Soft Cloth Surgical T ape, 4 x 10 (in/yd) 1 x Per Day/30  Days Discharge Instructions: Secure with tape as directed. Wound #4 - T - Web between 4th and 5th oe Wound Laterality: Left Cleanser: Wound Cleanser 1 x Per Day/30 Days Discharge Instructions: Cleanse the wound with wound cleanser prior to applying a clean dressing using gauze sponges, not tissue or cotton balls. Prim Dressing: Dakin's Solution 0.25%, 16 (oz) 1 x Per Day/30 Days ary Discharge Instructions: Moisten gauze with Dakin's solution Secondary Dressing: ABD Pad, 5x9 1 x Per Day/30 Days Discharge Instructions: Apply over primary dressing as directed. Secondary Dressing: Woven Gauze Sponge, Non-Sterile 4x4 in 1 x Per Day/30 Days Discharge Instructions: Apply over primary dressing as directed. Secondary Dressing: Zetuvit Plus 4x8 in 1 x Per Day/30 Days Discharge Instructions: Apply over primary dressing as directed. Secured With: Elastic Bandage 4 inch (ACE bandage) 1 x Per Day/30 Days Discharge Instructions: Secure with ACE bandage as directed. Secured With: [employer], 4.5x3.1 (in/yd) 1 x Per Day/30 Days Discharge Instructions: Secure with Kerlix as directed. Secured With: 50M Medipore H Soft Cloth Surgical T ape, 4 x 10 (in/yd) 1 x Per Day/30 Days Discharge Instructions: Secure with tape as directed. Wound #5 -  Sacrum Cleanser: Soap and Water 1 x Per Day/30 Days Discharge Instructions: May shower and wash wound with dial antibacterial soap and water prior to dressing change. Shawn Rhodes, Shawn Rhodes ([id number]) [id number] Page 7 of 15 Prim Dressing: Xeroform Occlusive Gauze Dressing, 4x4 in (DME) (Generic) 1 x Per Day/30 Days ary Discharge Instructions: Apply to wound bed as instructed Secondary Dressing: Zetuvit Plus Silicone Border Dressing 5x5 (in/in) (DME) (Generic) 1 x Per Day/30 Days Discharge Instructions: Apply silicone border over primary dressing as directed. Laboratory naerobe culture (MICRO) - (ICD10 [phone] - Non-pressure chronic ulcer  of other part of left foot Bacteria identified in Unspecified specimen by A with fat layer exposed) LOINC Code: 938-1 Convenience Name: Anaerobic culture Patient Medications llergies: No Known Drug Allergies A Notifications Medication Indication Start End prior to debridement 11/02/2021 lidocaine DOSE topical 5 % ointment - ointment topical Electronic Signature(s) Signed: 11/01/2021 5:59:56 PM By: Shawn Creamer RN, BSN Signed: 11/01/2021 6:42:42 PM By: Worthy Keeler PA-C Entered By: Shawn Rhodes on 11/01/2021 17:42:03 Prescription 11/01/2021 -------------------------------------------------------------------------------- Hedy Jacob PA Patient Name: Provider: Jun 15, 1975 [id number] Date of Birth: NPI#Jerilynn Mages [id number] Sex: DEA #: [id number] [id number] Phone #: License #: [place] Patient Address: [address] [place], [place] [address] [place], [place] [address] [phone] Allergies No Known Drug Allergies Provider's Orders naerobe culture - ICD10: L97.522 Bacteria identified in Unspecified specimen by A LOINC Code: 539-7 Convenience Name: Anaerobic culture Hand Signature: Date(s): Electronic Signature(s) Signed: 11/01/2021 5:59:56 PM By: Shawn Creamer RN, BSN Signed: 11/01/2021 6:42:42 PM By: Worthy Keeler PA-C Entered By: Shawn Rhodes on 11/01/2021 17:42:04 Shawn Rhodes ([id number]) [id number] Page 8 of 15 -------------------------------------------------------------------------------- Problem List Details Patient Name: Date of Service: Shawn Rhodes, Shawn Rhodes [place] 11/01/2021 1:15 PM Medical Record Number: [id number] Patient Account Number: [id number] Date of Birth/Sex: Treating RN: 1975-06-17 (46 y.o. M) Primary Care Provider: Shirline Frees Other Clinician: Referring Provider: Treating Provider/Extender: Vance Peper  in Treatment: 0 Active Problems ICD-10 Encounter Code Description Active Date MDM Diagnosis E10.621 Type 1 diabetes mellitus with foot ulcer 11/01/2021 No Yes L97.522 Non-pressure chronic ulcer of other part of left foot with fat layer exposed 11/01/2021 No Yes L97.518 Non-pressure chronic ulcer of other part of right foot with other specified 11/01/2021 No Yes severity L89.153 Pressure ulcer of sacral region, stage 3 11/01/2021 No Yes Z93.1 Gastrostomy status 11/01/2021 No Yes M62.81 Muscle weakness (generalized) 11/01/2021 No Yes Inactive Problems Resolved Problems Electronic Signature(s) Signed: 11/01/2021 3:36:19 PM By: Worthy Keeler PA-C Entered By: Worthy Keeler on 11/01/2021 15:36:19 -------------------------------------------------------------------------------- Progress Note Details Patient Name: Date of Service: Shawn Rhodes, Shawn Rhodes [place] 11/01/2021 1:15 PM Medical Record Number: [id number] Patient Account Number: [id number] Date of Birth/Sex: Treating RN: 02-25-1975 (46 y.o. M) Primary Care Provider: Shirline Frees Other Clinician: Referring Provider: Treating Provider/Extender: Vance Peper in Treatment: 0 Subjective Chief Complaint Shawn Rhodes ([id number]) [id number] Page 9 of 15 Information obtained from Patient Bilateral foot ulcers and sacral ulcer History of Present Illness (HPI) 11-01-2021 patient presents today for initial evaluation here in our clinic concerning issues that he has been having with multiple wounds over the left foot and right foot as well as the sacral area at this point. The patient does have a history of diabetes mellitus type 1 which is probably contributing to some degree with what we are seeing here. He also has a pressure ulcer on the sacral region  unfortunately that I think is more due to his generalized weakness. He does have a G-tube. He really is not in a very healthy state he is  seen with his sister as well as his mother today. They are actually contemplating going into hospice care and that is part of the conversation to be had as well how aggressive we go to be with any wound care measures at this point. I did discuss with him in detail some of what we were looking at here in as far as treatment options what types of things we had available to [place] at this point. Patient History Information obtained from Patient. Allergies No Known Drug Allergies Family History Hypertension - Maternal Grandparents, No family history of Cancer, Diabetes, Heart Disease, Kidney Disease, Lung Disease, Seizures, Stroke, Thyroid Problems, Tuberculosis. Social History Never smoker, Marital Status - Single, Alcohol Use - Never, Drug Use - No History, Caffeine Use - Never. Medical History Hematologic/Lymphatic Patient has history of Anemia Endocrine Patient has history of Type I Diabetes Denies history of Type II Diabetes Oncologic Patient has history of Received Radiation Denies history of Received Chemotherapy Patient is treated with Insulin. Blood sugar is tested. Hospitalization/Surgery History - 03/2021 gastrostomy tube. - 7/22 partial glossectomy right side neck. 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 Review of Systems (ROS) Constitutional Symptoms (General Health) Denies complaints or symptoms of Fatigue, Fever, Chills, Marked Weight Change. Eyes Complains or has symptoms of Vision Changes, blurry vision, states thinks due to pain meds Ear/Nose/Mouth/Throat Denies complaints or symptoms of Chronic sinus problems or rhinitis. Cardiovascular Denies complaints or symptoms of Chest pain. Genitourinary Denies complaints or symptoms of Frequent urination. Integumentary (Skin) Complains or has symptoms of Wounds. Musculoskeletal Complains or has  symptoms of Muscle Weakness. Neurologic Denies complaints or symptoms of Numbness/parasthesias. Psychiatric Denies complaints or symptoms of Claustrophobia. Objective Constitutional patient is hypotensive.. pulse regular and within target range for patient.Marland Kitchen respirations regular, non-labored and within target range for patient.Marland Kitchen temperature within target range for patient.. Well-nourished and well-hydrated in no acute distress. Vitals Time Taken: 2:58 PM, Temperature: 98.1 F, Pulse: 85 bpm, Respiratory Rate: 18 breaths/min, Blood Pressure: 95/60 mmHg, Capillary Blood Glucose: 300 mg/dl. General Notes: pt checked sugar post meal, caregiver stated gave insulin then came to appt. hasnt checked since Eyes conjunctiva clear no eyelid edema noted. pupils equal round and reactive to light and accommodation. Shawn Rhodes, Shawn Rhodes ([id number]) [id number] Page 10 of 15 Ears, Nose, Mouth, and Throat no gross abnormality of ear auricles or external auditory canals. normal hearing noted during conversation. mucus membranes moist. Respiratory normal breathing without difficulty. clear to auscultation bilaterally. Musculoskeletal Patient unable to walk without assistance. Psychiatric this patient is able to make decisions and demonstrates good insight into disease process. Alert and Oriented x 3. pleasant and cooperative. General Notes: Upon inspection patient has several areas of significant eschar noted over the feet there is really quite a lot going on at this point. Fortunately I do not see any signs of systemic infection necessarily but I do see a lot of of evidence that there could be some local infection of which we will get a go ahead and check into doing a culture to try to see what is being shown here. Fortunately I do think I can clearway some of the necrotic tissue today and that was performed with scissors and forceps carefully today. There is also some question of how  good his blood flow is arterially into the extremity. His blood pressure today is 95/60. Integumentary (Hair, Skin) Wound #1 status is Open. Original cause of wound was Pressure Injury. The date acquired was: 09/20/2021. The wound is located on the [place]. The wound measures 2.7cm length x 2.7cm width x 0.1cm depth; 5.726cm^2 area and 0.573cm^3 volume. There is Fat Layer (Subcutaneous Tissue) exposed. There is no tunneling or undermining noted. There is a medium amount of serosanguineous drainage noted. There is no granulation within the wound bed. There is a large (67-100%) amount of necrotic tissue within the wound bed including Eschar. Periwound temperature was noted as Cool/Cold. Wound #2 status is Open. Original cause of wound was Blister. The date acquired was: 09/20/2021. The wound is located on the [place]. The wound measures 2.4cm length x 2.4cm width x 0.3cm depth; 4.524cm^2 area and 1.357cm^3 volume. There is Fat Layer (Subcutaneous Tissue) exposed. There is no tunneling or undermining noted. There is a medium amount of serosanguineous drainage noted. There is no granulation within the wound bed. There is a large (67-100%) amount of necrotic tissue within the wound bed including Eschar. The periwound skin appearance exhibited: Maceration. Periwound temperature was noted as Cool/Cold. Wound #3 status is Open. Original cause of wound was Blister. The date acquired was: 09/20/2021. The wound is located on the [place]. The wound measures 4cm length x 3.3cm width x 0.3cm depth; 10.367cm^2 area and 3.11cm^3 volume. There is Fat Layer (Subcutaneous Tissue) exposed. There is no tunneling or undermining noted. There is a large amount of serosanguineous drainage noted. There is no granulation within the wound bed. There is a large (67-100%) amount of necrotic tissue within the wound bed including Eschar. The periwound skin appearance had no  abnormalities noted for texture. The periwound skin appearance had no abnormalities noted for color. The periwound skin appearance exhibited: Maceration. Periwound temperature was noted as Cool/Cold. Wound #4 status is Open. Original cause of wound was Gradually Appeared. The date acquired was: 09/20/2021. The wound is located on the Left T - Web oe between 4th and 5th. The wound measures 3.2cm length x 0.7cm width x 0.2cm depth; 1.759cm^2 area and 0.352cm^3 volume. There is Fat Layer (Subcutaneous Tissue) exposed. There is no tunneling or undermining noted. There is a medium amount of serosanguineous drainage noted. There is small (1- 33%) red granulation within the wound bed. There is a large (67-100%) amount of necrotic tissue within the wound bed including Eschar and Adherent Slough. The periwound skin appearance had no abnormalities noted for texture. The periwound skin appearance had no abnormalities noted for color. The periwound skin appearance exhibited: Maceration. Periwound temperature was noted as Cool/Cold. Wound #5 status is Open. Original cause of wound was Pressure Injury. The date acquired was: 09/20/2021. The wound is located on the Sac[place]. The wound measures 2.7cm length x 1.6cm width x 0.1cm depth; 3.393cm^2 area and 0.339cm^3 volume. There is Fat Layer (Subcutaneous Tissue) exposed. There is no tunneling or undermining noted. There is a medium amount of serosanguineous drainage noted. There is no granulation within the wound bed. There is a large (67-100%) amount of necrotic tissue within the wound bed including Adherent Slough. The periwound skin appearance exhibited: Erythema. The surrounding wound skin color is noted with erythema. 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 There was a Selective/Open Wound Non-Viable Tissue Debridement with a total area of 5.76 sq cm performed by Worthy Keeler, PA. With the following instrument(s): Forceps, and Scissors to remove Non-Viable tissue/material. Material removed includes Eschar after achieving pain control using Lidocaine 5% topical ointment. No specimens were taken. A time out was conducted at 15:50, prior to the start of the procedure. A Minimum amount of bleeding was controlled with Pressure. The procedure was tolerated well with a pain level of 0 throughout and a pain level of 0 following the procedure. Post Debridement Measurements: 2.4cm length x 2.4cm width x 0.3cm depth; 1.357cm^3 volume. Character of Wound/Ulcer Post Debridement is improved. Severity of Tissue Post Debridement is: Fat layer exposed. Post procedure Diagnosis Wound #2: Same as Pre-Procedure General Notes: Scribed for Jeri Cos, PA by Shawn Creamer, RN. Wound #3 Pre-procedure diagnosis of Wound #3 is a Diabetic Wound/Ulcer of the Lower Extremity located on the Left,Medial,Plantar Foot .Severity of Tissue Pre Debridement is: Fat layer exposed. There was a Excisional Skin/Subcutaneous Tissue/Muscle Debridement with a total area of 13.2 sq cm performed by Worthy Keeler, PA. With the following instrument(s): Forceps, and Scissors to remove Non-Viable tissue/material. Material removed includes Muscle, Eschar, Slough, Skin: Dermis, and Skin: Epidermis after achieving pain control using Lidocaine 5% topical ointment. 1 specimen was taken by a Tissue Culture and sent to the lab per facility protocol. A time out was conducted at 15:50, prior to the start of the procedure. A  Minimum amount of bleeding was controlled with Shawn Rhodes ([id number]) [id number] Page 11 of 15 Pressure. The procedure was tolerated well with a pain level of 0 throughout and a pain level of 0 following the procedure. Post Debridement Measurements: 4cm length x 3.3cm width x 0.3cm depth; 3.11cm^3 volume. Character of Wound/Ulcer Post Debridement is improved. Severity of Tissue Post Debridement is: Necrosis of muscle. Post procedure Diagnosis Wound #3: Same as Pre-Procedure General Notes: scribed for Jeri Cos, PA by Shawn Creamer, RN. Wound #4 Pre-procedure diagnosis of Wound #4 is a Diabetic Wound/Ulcer of the Lower Extremity located on the Left T - Web between 4th and 5th .Severity of Tissue oe Pre Debridement is: Fat layer exposed. There was a Excisional Skin/Subcutaneous Tissue/Muscle Debridement with a total area of 2.24 sq cm performed by Worthy Keeler, PA. With the following instrument(s): Forceps, and Scissors to remove Non-Viable tissue/material. Material removed includes Muscle, Eschar, Slough, Skin: Dermis, and Skin: Epidermis after achieving pain control using Lidocaine 5% topical ointment. No specimens were taken. A time out was conducted at 15:50, prior to the start of the procedure. A Minimum amount of bleeding was controlled with Pressure. The procedure was tolerated well with a pain level of 0 throughout and a pain level of 0 following the procedure. Post Debridement Measurements: 3.2cm length x 0.7cm width x 0.2cm depth; 0.352cm^3 volume. Character of Wound/Ulcer Post Debridement is improved. Severity of Tissue Post Debridement is: Fat layer exposed. Post procedure Diagnosis Wound #4: Same as Pre-Procedure General Notes: Scribed for Jeri Cos, PA by Shawn Creamer, RN. Plan Follow-up Appointments: Return Appointment in 2 weeks. - with Jeri Cos, PA Anesthetic: Wound #1 Right,Lateral,Plantar Foot: (In clinic) Topical Lidocaine 5% applied  to wound bed Wound #2 Left,Distal,Plantar Foot: (In clinic) Topical  Lidocaine 5% applied to wound bed Wound #3 Left,Medial,Plantar Foot: (In clinic) Topical Lidocaine 5% applied to wound bed Wound #4 Left T - Web between 4th and 5th: oe (In clinic) Topical Lidocaine 5% applied to wound bed Wound #5 Sacrum: (In clinic) Topical Lidocaine 5% applied to wound bed Bathing/ Shower/ Hygiene: May shower and wash wound with soap and water. Off-Loading: Wound #1 Right,Lateral,Plantar Foot: Gel wheelchair cushion Turn and reposition every 2 hours Prevalon Boot Wound #2 Left,Distal,Plantar Foot: Gel wheelchair cushion Turn and reposition every 2 hours Prevalon Boot Wound #3 Left,Medial,Plantar Foot: Gel wheelchair cushion Turn and reposition every 2 hours Prevalon Boot Wound #4 Left T - Web between 4th and 5th: oe Gel wheelchair cushion Turn and reposition every 2 hours Prevalon Boot Wound #5 Sacrum: Gel wheelchair cushion Turn and reposition every 2 hours Prevalon Boot Laboratory ordered were: Anaerobic culture The following medication(s) was prescribed: lidocaine topical 5 % ointment ointment topical for prior to debridement was prescribed at facility WOUND #1: - Foot Wound Laterality: Plantar, Right, Lateral Cleanser: Soap and Water 1 x Per Day/30 Days Discharge Instructions: May shower and wash wound with dial antibacterial soap and water prior to dressing change. Topical: Povidone-Iodine 10% PVP Swabstick, 1(swab) (DME) (Generic) 1 x Per Day/30 Days Secondary Dressing: Woven Gauze Sponge, Non-Sterile 4x4 in (DME) (Generic) 1 x Per Day/30 Days Discharge Instructions: Apply over primary dressing as directed. Secured With: 38M Medipore H Soft Cloth Surgical T ape, 4 x 10 (in/yd) (DME) (Generic) 1 x Per Day/30 Days Discharge Instructions: Secure with tape as directed. WOUND #2: - Foot Wound Laterality: Plantar, Left, Distal Cleanser: Wound Cleanser 1 x Per Day/30 Days Discharge  Instructions: Cleanse the wound with wound cleanser prior to applying a clean dressing using gauze sponges, not tissue or cotton balls. Prim Dressing: Dakin's Solution 0.25%, 16 (oz) 1 x Per Day/30 Days ary Discharge Instructions: Moisten gauze with Dakin's solution Secondary Dressing: ABD Pad, 5x9 (DME) (Generic) 1 x Per Day/30 Days Discharge Instructions: Apply over primary dressing as directed. Secondary Dressing: Woven Gauze Sponge, Non-Sterile 4x4 in (DME) (Generic) 1 x Per Day/30 Days Discharge Instructions: Apply over primary dressing as directed. Secondary Dressing: Zetuvit Plus 4x8 in (DME) (Generic) 1 x Per Day/30 Days Discharge Instructions: Apply over primary dressing as directed. Secured With: Elastic Bandage 4 inch (ACE bandage) (DME) (Generic) 1 x Per Day/30 Days Discharge Instructions: Secure with ACE bandage as directed. Secured With: [employer], 4.5x3.1 (in/yd) (DME) (Generic) 1 x Per Day/30 Days Discharge Instructions: Secure with Kerlix as directed. Secured With: 38M Medipore H Soft Cloth Surgical T ape, 4 x 10 (in/yd) (DME) (Generic) 1 x Per Day/30 Days Discharge Instructions: Secure with tape as directed. WOUND #3: - Foot Wound Laterality: Plantar, Left, Medial Cleanser: Wound Cleanser 1 x Per Day/30 Days JAMILLE, YOSHINO ([id number]) [id number] Page 12 of 15 Discharge Instructions: Cleanse the wound with wound cleanser prior to applying a clean dressing using gauze sponges, not tissue or cotton balls. Prim Dressing: Dakin's Solution 0.25%, 16 (oz) 1 x Per Day/30 Days ary Discharge Instructions: Moisten gauze with Dakin's solution Secondary Dressing: ABD Pad, 5x9 1 x Per Day/30 Days Discharge Instructions: Apply over primary dressing as directed. Secondary Dressing: Woven Gauze Sponge, Non-Sterile 4x4 in 1 x Per Day/30 Days Discharge Instructions: Apply over primary dressing as directed. Secondary Dressing: Zetuvit Plus 4x8 in 1 x  Per Day/30 Days Discharge Instructions: Apply over primary dressing as directed. Secured With: Elastic Bandage 4 inch (ACE bandage) 1  x Per Day/30 Days Discharge Instructions: Secure with ACE bandage as directed. Secured With: [employer], 4.5x3.1 (in/yd) 1 x Per Day/30 Days Discharge Instructions: Secure with Kerlix as directed. Secured With: 15M Medipore H Soft Cloth Surgical T ape, 4 x 10 (in/yd) 1 x Per Day/30 Days Discharge Instructions: Secure with tape as directed. WOUND #4: - T - Web between 4th and 5th Wound Laterality: Left oe Cleanser: Wound Cleanser 1 x Per Day/30 Days Discharge Instructions: Cleanse the wound with wound cleanser prior to applying a clean dressing using gauze sponges, not tissue or cotton balls. Prim Dressing: Dakin's Solution 0.25%, 16 (oz) 1 x Per Day/30 Days ary Discharge Instructions: Moisten gauze with Dakin's solution Secondary Dressing: ABD Pad, 5x9 1 x Per Day/30 Days Discharge Instructions: Apply over primary dressing as directed. Secondary Dressing: Woven Gauze Sponge, Non-Sterile 4x4 in 1 x Per Day/30 Days Discharge Instructions: Apply over primary dressing as directed. Secondary Dressing: Zetuvit Plus 4x8 in 1 x Per Day/30 Days Discharge Instructions: Apply over primary dressing as directed. Secured With: Elastic Bandage 4 inch (ACE bandage) 1 x Per Day/30 Days Discharge Instructions: Secure with ACE bandage as directed. Secured With: [employer], 4.5x3.1 (in/yd) 1 x Per Day/30 Days Discharge Instructions: Secure with Kerlix as directed. Secured With: 15M Medipore H Soft Cloth Surgical T ape, 4 x 10 (in/yd) 1 x Per Day/30 Days Discharge Instructions: Secure with tape as directed. WOUND #5: - Sacrum Wound Laterality: Cleanser: Soap and Water 1 x Per Day/30 Days Discharge Instructions: May shower and wash wound with dial antibacterial soap and water prior to dressing change. Prim Dressing: Xeroform Occlusive Gauze Dressing, 4x4 in  (DME) (Generic) 1 x Per Day/30 Days ary Discharge Instructions: Apply to wound bed as instructed Secondary Dressing: Zetuvit Plus Silicone Border Dressing 5x5 (in/in) (DME) (Generic) 1 x Per Day/30 Days Discharge Instructions: Apply silicone border over primary dressing as directed. 1. Based on what I am seeing currently I do believe that the patient is actually doing somewhat poorly in general in regard to his overall health. 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 I think that he really is at a point of deciding to go on hospice care which again could change the trajectory of which way you we addressed things here as far as treatment is concerned. Right now I do believe that my suggestion would probably be that we go ahead and check a culture and then also were going to see about getting something started from a dressing standpoint for his wounds. We can use Dakin's solution for a lot of the wounds to try to see if we can help to clear out some of the necrotic tissue. 2. I am also can recommend that we have the patient continue with the Betadine to the dry eschar areas and specifically on the sacral region I would recommend Xeroform. A bordered foam to cover. 3. I do think he needs to be very careful about pressure both to the feet as well as to the sacral region the less he can do the better off he will be in that regard. We will see patient back for reevaluation in 2 weeks here in the clinic. If anything worsens or changes patient will contact our office for additional recommendations. Electronic Signature(s) Signed: 11/07/2021 5:02:26 PM By: Worthy Keeler PA-C Entered By: Worthy Keeler on 11/07/2021  17:02:26 -------------------------------------------------------------------------------- HxROS Details Patient Name: Date of Service: DELMAS, FAUCETT [place] 11/01/2021 1:15  PM Medical Record Number: [id number] Patient Account Number: [id number] Date of Birth/Sex: Treating RN: 1975-08-22 (46 y.o. Hessie Diener Primary Care Provider: Shirline Frees Other Clinician: Referring Provider: Treating Provider/Extender: Vance Peper in Treatment: 0 Information Obtained From Patient Constitutional Symptoms (General Health) Complaints and Symptoms: Negative for: Fatigue; Fever; Chills; Marked Weight Change NAKSH, RADI ([id number]) [id number] Page 13 of 15 Eyes Complaints and Symptoms: Positive for: Vision Changes Review of System Notes: blurry vision, states thinks due to pain meds Ear/Nose/Mouth/Throat Complaints and Symptoms: Negative for: Chronic sinus problems or rhinitis Cardiovascular Complaints and Symptoms: Negative for: Chest pain Genitourinary Complaints and Symptoms: Negative for: Frequent urination Integumentary (Skin) Complaints and Symptoms: Positive for: Wounds Musculoskeletal Complaints and Symptoms: Positive for: Muscle Weakness Neurologic Complaints and Symptoms: Negative for: Numbness/parasthesias Psychiatric Complaints and Symptoms: Negative for: Claustrophobia Hematologic/Lymphatic Medical History: Positive for: Anemia Respiratory Medical History: Past Medical History Notes: trach 09/18/2021 Gastrointestinal Medical History: Past Medical History Notes: peg tube march 2023 Endocrine Medical History: Positive for: Type I Diabetes Negative for: Type II Diabetes Time with diabetes: since age 28 Treated with: Insulin Blood sugar tested every day: Yes Tested : as needed ( Dexcom) Immunological Oncologic Medical History: Positive for: Received Radiation Negative for: Received Chemotherapy Past Medical History Notes: tongue Ca-squamous cell carcinoma lateral tongue. ALHAJI, MCNEAL ([id number]) [id number] Page 14 of  15 Immunizations Pneumococcal Vaccine: Received Pneumococcal Vaccination: No Implantable Devices None Hospitalization / Surgery History Type of Hospitalization/Surgery 03/2021 gastrostomy tube 7/22 partial glossectomy right side neck Family and Social History Cancer: No; Diabetes: No; Heart Disease: No; Hypertension: Yes - Maternal Grandparents; Kidney Disease: No; Lung Disease: No; Seizures: No; Stroke: No; Thyroid Problems: No; Tuberculosis: No; Never smoker; Marital Status - Single; Alcohol Use: Never; Drug Use: No History; Caffeine Use: Never; Financial Concerns: No; Food, Clothing or Shelter Needs: No; Support System Lacking: No; Transportation Concerns: No Electronic Signature(s) Signed: 11/01/2021 5:59:56 PM By: Shawn Creamer RN, BSN Signed: 11/01/2021 6:42:42 PM By: Worthy Keeler PA-C Signed: 11/01/2021 6:57:47 PM By: Deon Pilling RN, BSN Entered By: Shawn Rhodes on 11/01/2021 14:09:58 -------------------------------------------------------------------------------- SuperBill Details Patient Name: Date of Service: MELESIO, MADARA [place] 11/01/2021 Medical Record Number: [id number] Patient Account Number: [id number] Date of Birth/Sex: Treating RN: 06/07/1975 (46 y.o. [name] Primary Care Provider: Shirline Frees Other Clinician: Referring Provider: Treating Provider/Extender: Vance Peper in Treatment: 0 Diagnosis Coding ICD-10 Codes Code Description [phone] Type 1 diabetes mellitus with foot ulcer L97.522 Non-pressure chronic ulcer of other part of left foot with fat layer exposed L97.518 Non-pressure chronic ulcer of other part of right foot with other specified severity L89.153 Pressure ulcer of sacral region, stage 3 Z93.1 Gastrostomy status M62.81 Muscle weakness (generalized) Facility Procedures : CPT4 Code: [id number] Description: 99213 - WOUND CARE VISIT-LEV 3 EST PT Modifier: 25 Quantity: 1 : CPT4 Code:  [id number] Description: 11043 - DEB MUSC/FASCIA 20 SQ CM/< ICD-10 Diagnosis Description L97.522 Non-pressure chronic ulcer of other part of left foot with fat layer exposed E10.621 Type 1 diabetes mellitus with foot ulcer L97.518 Non-pressure chronic ulcer of other  part of right foot with other specified sever L89.153 Pressure ulcer of sacral region, stage 3 Modifier: ity Quantity: 1 Physician Procedures : CPT4 Code Description Modifier [id number] WC PHYS LEVEL 3 NEW PT 25 ICD-10 Diagnosis Description E10.621 Type 1 diabetes mellitus with foot ulcer L97.522 Non-pressure chronic ulcer of other part  of left foot with fat layer exposed L97.518 Non-pressure  chronic ulcer of other part of right foot with other specified severity L89.153 Pressure ulcer of sacral region, stage 3 Quantity: 1 : [id number] 11043 - WC PHYS DEBR MUSCLE/FASCIA 20 SQ CM ICD-10 Diagnosis Description L97.522 Non-pressure chronic ulcer of other part of left foot with fat layer exposed E10.621 Type 1 diabetes mellitus with foot ulcer L97.518 Non-pressure chronic ulcer of  other part of right foot with other specified severity L89.153 Pressure ulcer of sacral region, stage 3 Quantity: 1 : [id number] 97597 - WC PHYS DEBR WO ANESTH 20 SQ CM ICD-10 Diagnosis Description L97.522 Non-pressure chronic ulcer of other part of left foot with fat layer exposed L97.518 Non-pressure chronic ulcer of other part of right foot with other specified  severity L89.153 Pressure ulcer of sacral region, stage 3 E10.621 Type 1 diabetes mellitus with foot ulcer Quantity: 1 Electronic Signature(s) Signed: 11/07/2021 5:03:41 PM By: Worthy Keeler PA-C Previous Signature: 11/01/2021 5:59:56 PM Version By: Shawn Creamer RN, BSN Previous Signature: 11/01/2021 6:42:42 PM Version By: Worthy Keeler PA-C Entered By: Worthy Keeler on 11/07/2021 17:03:41

## 2021-11-02 NOTE — Progress Notes (Signed)
CAIDIN, HEIDENREICH ([id number]) [id number] Page 1 of 4 Visit Report for 11/01/2021 Abuse Risk Screen Details Patient Name: Date of Service: OMA, ALPERT [place] 11/01/2021 1:15 PM Medical Record Number: [id number] Patient Account Number: [id number] Date of Birth/Sex: Treating RN: 03-31-1975 (47 y.o. [name] Primary Care Deitrich Steve: Shirline Frees Other Clinician: Referring Macaria Bias: Treating Anselm Aumiller/Extender: Vance Peper in Treatment: 0 Abuse Risk Screen Items Answer ABUSE RISK SCREEN: Has anyone close to you tried to hurt or harm you recentlyo No Do you feel uncomfortable with anyone in your familyo No Has anyone forced you do things that you didnt want to doo No Electronic Signature(s) Signed: 11/01/2021 5:59:56 PM By: Sharyn Creamer RN, BSN Entered By: Sharyn Creamer on 11/01/2021 14:10:05 -------------------------------------------------------------------------------- Activities of Daily Living Details Patient Name: Date of Service: [place], [place] 11/01/2021 1:15 PM Medical Record Number: [id number] Patient Account Number: [id number] Date of Birth/Sex: Treating RN: [birth year] (46 y.o. [name] Primary Care Jonah Gingras: Shirline Frees Other Clinician: Referring Tresha Muzio: Treating Yared Barefoot/Extender: Vance Peper in Treatment: 0 Activities of Daily Living Items Answer Activities of Daily Living (Please select one for each item) Drive Automobile Not Able T Medications ake Need Assistance Use T elephone Need Assistance Care for Appearance Need Assistance Use T oilet Need Assistance Bath / Shower Need Assistance Dress Self Need Assistance Feed Self Need Assistance Walk Need Assistance Get In / Out Bed Need Assistance Housework Need Assistance Prepare Meals Need Assistance Handle Money Need Assistance Shop for Self Not Able Electronic Signature(s) Signed: 11/01/2021 5:59:56  PM By: Sharyn Creamer RN, BSN Entered By: Sharyn Creamer on 11/01/2021 14:10:50 [name] ([id number]) [phone] [id number] Page 2 of 4 -------------------------------------------------------------------------------- Education Screening Details Patient Name: Date of Service: ADAEL, CULBREATH [place] 11/01/2021 1:15 PM Medical Record Number: [id number] Patient Account Number: [id number] Date of Birth/Sex: Treating RN: 04/18/1975 (46 y.o. [name] Primary Care Adolphe Fortunato: Shirline Frees Other Clinician: Referring Arnetta Odeh: Treating Lilliam Chamblee/Extender: Vance Peper in Treatment: 0 Learning Preferences/Education Level/Primary Language Learning Preference: Explanation, Demonstration, Printed Material Preferred Language: English Cognitive Barrier Language Barrier: No Translator Needed: No Memory Deficit: No Emotional Barrier: No Cultural/Religious Beliefs Affecting Medical Care: No Physical Barrier Impaired Vision: Yes Impaired Hearing: No Decreased Hand dexterity: No Knowledge/Comprehension Knowledge Level: High Comprehension Level: High Ability to understand written instructions: High Ability to understand verbal instructions: High Motivation Anxiety Level: Calm Cooperation: Cooperative Education Importance: Acknowledges Need Interest in Health Problems: Asks Questions Perception: Coherent Willingness to Engage in Self-Management High Activities: Readiness to Engage in Self-Management High Activities: Electronic Signature(s) Signed: 11/01/2021 5:59:56 PM By: Sharyn Creamer RN, BSN Entered By: Sharyn Creamer on 11/01/2021 14:11:31 -------------------------------------------------------------------------------- Fall Risk Assessment Details Patient Name: Date of Service: [place], [place] 11/01/2021 1:15 PM Medical Record Number: [id number] Patient Account Number: [id number] Date of Birth/Sex: Treating RN: [birth year] (46  y.o. [name] Primary Care Keyondra Lagrand: Shirline Frees Other Clinician: Referring Cohen Doleman: Treating Precious Segall/Extender: Vance Peper in Treatment: 0 Fall Risk Assessment Items Have you had 2 or more falls in the last 12 monthso 0 No Have you had any fall that resulted in injury in the last 12 monthso 0 No JONA ZAPPONE, Lennette Bihari ([id number]) [id number] Page 3 of 4 History of falling - immediate or within 3 months 0 No Secondary diagnosis (Do you have 2 or more medical diagnoseso) 15 Yes Ambulatory aid None/bed rest/wheelchair/nurse 0 Yes Crutches/cane/walker 0 No Furniture 0 No Intravenous therapy Access/Saline/Heparin  Lock 0 No Gait/Transferring Normal/ bed rest/ wheelchair 0 No Weak (short steps with or without shuffle, stooped but able to lift head while walking, may seek 0 No support from furniture) Impaired (short steps with shuffle, may have difficulty arising from chair, head down, impaired 20 Yes balance) Mental Status Oriented to own ability 0 No Electronic Signature(s) Signed: 11/01/2021 5:59:56 PM By: Sharyn Creamer RN, BSN Entered By: Sharyn Creamer on 11/01/2021 14:12:55 -------------------------------------------------------------------------------- Foot Assessment Details Patient Name: Date of Service: EMON, LANCE [place] 11/01/2021 1:15 PM Medical Record Number: [id number] Patient Account Number: [id number] Date of Birth/Sex: Treating RN: 07-08-1975 (46 y.o. [name] Primary Care Analeia Ismael: Shirline Frees Other Clinician: Referring Chanti Golubski: Treating Olivea Sonnen/Extender: Vance Peper in Treatment: 0 Foot Assessment Items Site Locations + = Sensation present, - = Sensation absent, C = Callus, U = Ulcer R = Redness, W = Warmth, M = Maceration, PU = Pre-ulcerative lesion F = Fissure, S = Swelling, D = Dryness Assessment Right: Left: Other Deformity: No  No Prior Foot Ulcer: No No Prior Amputation: No No Charcot Joint: No No Ambulatory Status: GaitBREYLAN, LEFEVERS ([id number]) [phone] [id number] Page 4 of 4 Electronic Signature(s) Signed: 11/01/2021 5:59:56 PM By: Sharyn Creamer RN, BSN Entered By: Sharyn Creamer on 11/01/2021 14:21:22 -------------------------------------------------------------------------------- Nutrition Risk Screening Details Patient Name: Date of Service: JULLIAN, CLAYSON [place] 11/01/2021 1:15 PM Medical Record Number: [id number] Patient Account Number: [id number] Date of Birth/Sex: Treating RN: 01-25-1975 (46 y.o. [name] Primary Care Dartanion Teo: Shirline Frees Other Clinician: Referring Kunio Cummiskey: Treating Bronislaw Switzer/Extender: Vance Peper in Treatment: 0 Height (in): Weight (lbs): Body Mass Index (BMI): Nutrition Risk Screening Items Score Screening NUTRITION RISK SCREEN: I have an illness or condition that made me change the kind and/or amount of food I eat 2 Yes I eat fewer than two meals per day 0 No I eat few fruits and vegetables, or milk products 0 No I have three or more drinks of beer, liquor or wine almost every day 0 No I have tooth or mouth problems that make it hard for me to eat 2 Yes I don't always have enough money to buy the food I need 0 No I eat alone most of the time 0 No I take three or more different prescribed or over-the-counter drugs a day 1 Yes Without wanting to, I have lost or gained 10 pounds in the last six months 2 Yes I am not always physically able to shop, cook and/or feed myself 2 Yes Nutrition Protocols Good Risk Protocol Moderate Risk Protocol High Risk Proctocol Risk Level: High Risk Score: 9 Electronic Signature(s) Signed: 11/01/2021 5:59:56 PM By: Sharyn Creamer RN, BSN Entered By: Sharyn Creamer on 11/01/2021 14:13:24

## 2021-11-02 NOTE — Progress Notes (Addendum)
VADEN, MATLICK ([id number]) [id number] Page 1 of 10 Visit Report for 11/01/2021 Allergy List Details Patient Name: Date of Service: Shawn Rhodes, Shawn Rhodes Shawn Rhodes 11/01/2021 1:15 PM Medical Record Number: [id number] Patient Account Number: [id number] Date of Birth/Sex: Treating RN: 10-21-1975 (46 y.o. Hessie Diener Primary Care Osborn Pullin: Shirline Frees Other Clinician: Referring Rutherford Alarie: Treating Cecil Vandyke/Extender: Vance Peper in Treatment: 0 Allergies Active Allergies No Known Drug Allergies Allergy Notes Electronic Signature(s) Signed: 11/01/2021 5:59:56 PM By: Sharyn Creamer RN, BSN Entered By: Sharyn Creamer on 11/01/2021 14:03:27 -------------------------------------------------------------------------------- Arrival Information Details Patient Name: Date of Service: Shawn Rhodes, Shawn Rhodes Shawn Rhodes 11/01/2021 1:15 PM Medical Record Number: [id number] Patient Account Number: [id number] Date of Birth/Sex: Treating RN: 23-Jul-1975 (46 y.o. [name] Primary Care Michille Mcelrath: Shirline Frees Other Clinician: Referring Carolynne Schuchard: Treating Aniayah Alaniz/Extender: Vance Peper in Treatment: 0 Visit Information Patient Arrived: Wheel Chair Arrival Time: 13:56 Accompanied By: mother, sister Transfer Assistance: Manual Patient Identification Verified: Yes Secondary Verification Process Completed: Yes Electronic Signature(s) Signed: 11/01/2021 5:59:56 PM By: Sharyn Creamer RN, BSN Entered By: Sharyn Creamer on 11/01/2021 13:58:15 -------------------------------------------------------------------------------- Clinic Level of Care Assessment Details Patient Name: Date of Service: Shawn Rhodes, Shawn Rhodes Shawn Rhodes 11/01/2021 1:15 PM Medical Record Number: [id number] Patient Account Number: [id number] Date of Birth/Sex: Treating RN: 1975/10/25 (46 y.o. [name] Primary Care Akaila Rambo: Shirline Frees Other Clinician: Lorette Rhodes  ([id number]) [id number] Page 2 of 10 Referring Caryl Manas: Treating Denyse Fillion/Extender: Vance Peper in Treatment: 0 Clinic Level of Care Assessment Items TOOL 1 Quantity Score X- 1 0 Use when EandM and Procedure is performed on INITIAL visit ASSESSMENTS - Nursing Assessment / Reassessment X- 1 20 General Physical Exam (combine w/ comprehensive assessment (listed just below) when performed on new pt. evals) X- 1 25 Comprehensive Assessment (HX, ROS, Risk Assessments, Wounds Hx, etc.) ASSESSMENTS - Wound and Skin Assessment / Reassessment []$  - 0 Dermatologic / Skin Assessment (not related to wound area) ASSESSMENTS - Ostomy and/or Continence Assessment and Care []$  - 0 Incontinence Assessment and Management []$  - 0 Ostomy Care Assessment and Management (repouching, etc.) PROCESS - Coordination of Care []$  - 0 Simple Patient / Family Education for ongoing care X- 1 20 Complex (extensive) Patient / Family Education for ongoing care X- 1 10 Staff obtains Programmer, systems, Records, T Results / Process Orders est []$  - 0 Staff telephones HHA, Nursing Homes / Clarify orders / etc []$  - 0 Routine Transfer to another Facility (non-emergent condition) []$  - 0 Routine Hospital Admission (non-emergent condition) X- 1 15 New Admissions / Biomedical engineer / Ordering NPWT Apligraf, etc. , []$  - 0 Emergency Hospital Admission (emergent condition) PROCESS - Special Needs []$  - 0 Pediatric / Minor Patient Management []$  - 0 Isolation Patient Management []$  - 0 Hearing / Language / Visual special needs []$  - 0 Assessment of Community assistance (transportation, D/C planning, etc.) []$  - 0 Additional assistance / Altered mentation []$  - 0 Support Surface(s) Assessment (bed, cushion, seat, etc.) INTERVENTIONS - Miscellaneous []$  - 0 External ear exam []$  - 0 Patient Transfer (multiple staff / Civil Service fast streamer / Similar devices) []$  - 0 Simple Staple /  Suture removal (25 or less) []$  - 0 Complex Staple / Suture removal (26 or more) []$  - 0 Hypo/Hyperglycemic Management (do not check if billed separately) []$  - 0 Ankle / Brachial Index (ABI) - do not check if billed separately Has the patient been seen at the hospital within the last three years: Yes Total Score: 90 Level  Of Care: New/Established - Level 3 Electronic Signature(s) Signed: 11/01/2021 5:59:56 PM By: Sharyn Creamer RN, BSN Entered By: Sharyn Creamer on 11/01/2021 17:04:09 Encounter Discharge Information Details -------------------------------------------------------------------------------- Shawn Rhodes (Shawn Rhodes) [id number] Page 3 of 10 Patient Name: Date of Service: Shawn Rhodes Shawn Rhodes 11/01/2021 1:15 PM Medical Record Number: Shawn Rhodes Patient Account Number: [id number] Date of Birth/Sex: Treating RN: [birth year] (46 y.o. [name] Primary Care Jamaiya Tunnell: Shirline Frees Other Clinician: Referring Plez Belton: Treating Sequoya Hogsett/Extender: Vance Peper in Treatment: 0 Encounter Discharge Information Items Post Procedure Vitals Discharge Condition: Stable Temperature (F): 98.1 Ambulatory Status: Wheelchair Pulse (bpm): 85 Discharge Destination: Home Respiratory Rate (breaths/min): 18 Transportation: Private Auto Blood Pressure (mmHg): 95/60 Accompanied By: mother, sister Schedule Follow-up Appointment: Yes Clinical Summary of Care: Patient Declined Electronic Signature(s) Signed: 11/01/2021 5:59:56 PM By: Sharyn Creamer RN, BSN Entered By: Sharyn Creamer on 11/01/2021 17:58:13 -------------------------------------------------------------------------------- Lower Extremity Assessment Details Patient Name: Date of Service: Shawn Rhodes Shawn Rhodes 11/01/2021 1:15 PM Medical Record Number: Shawn Rhodes Patient Account Number: [id number] Date of Birth/Sex: Treating RN: 05/24/1975 (46 y.o. [name] Primary Care  Daryel Kenneth: Shirline Frees Other Clinician: Referring Afshin Chrystal: Treating Kacy Hegna/Extender: Vance Peper in Treatment: 0 Edema Assessment Assessed: [Left: No] [Right: No] [Left: Edema] [Right: :] Calf Left: Right: Point of Measurement: From Medial Instep 34 cm 35 cm Ankle Left: Right: Point of Measurement: From Medial Instep 25 cm 26 cm Vascular Assessment Pulses: Dorsalis Pedis Palpable: [Left:Yes] [Right:Yes] Electronic Signature(s) Signed: 11/01/2021 5:59:56 PM By: Sharyn Creamer RN, BSN Entered By: Sharyn Creamer on 11/01/2021 14:23:56 -------------------------------------------------------------------------------- [place] Details Patient Name: Date of Service: Shawn Rhodes, Shawn Rhodes [place] 11/01/2021 1:15 PM Medical Record Number: Shawn Rhodes Patient Account Number: [id number] MIKIEL, BERGMAN (Shawn Rhodes) [id number] Page 4 of 10 Date of Birth/Sex: Treating RN: 11-16-1975 (46 y.o. [name] Primary Care Garnette Greb: Other Clinician: Shirline Frees Referring Harjit Douds: Treating Jerene Yeager/Extender: Vance Peper in Treatment: 0 Active Inactive Electronic Signature(s) Signed: 11/09/2021 9:00:48 AM By: Deon Pilling RN, BSN Signed: 02/26/2022 9:55:17 AM By: Sharyn Creamer RN, BSN Previous Signature: 11/01/2021 5:59:56 PM Version By: Sharyn Creamer RN, BSN Entered By: Deon Pilling on 11/09/2021 09:00:48 -------------------------------------------------------------------------------- Pain Assessment Details Patient Name: Date of Service: Shawn Rhodes, Shawn Rhodes [place] 11/01/2021 1:15 PM Medical Record Number: Shawn Rhodes Patient Account Number: [id number] Date of Birth/Sex: Treating RN: 12/22/1975 (46 y.o. [name] Primary Care Hudsyn Barich: Shirline Frees Other Clinician: Referring Kauan Kloosterman: Treating Shraga Custard/Extender: Vance Peper in Treatment: 0 Active  Problems Location of Pain Severity and Description of Pain Patient Has Paino Yes Site Locations Rate the pain. Current Pain Level: 6 Pain Management and Medication Current Pain Management: Notes pt states pain of 5 to sacrum, 6 to left foot, 3-4 right foot Electronic Signature(s) Signed: 11/01/2021 5:59:56 PM By: Sharyn Creamer RN, BSN Entered By: Sharyn Creamer on 11/01/2021 14:48:21 Shawn Rhodes (Shawn Rhodes) [id number] Page 5 of 10 -------------------------------------------------------------------------------- Patient/Caregiver Education Details Patient Name: Date of Service: Shawn Rhodes, Shawn Rhodes [place] 10/25/2023andnbsp1:15 PM Medical Record Number: Shawn Rhodes Patient Account Number: [id number] Date of Birth/Gender: Treating RN: October 10, 1975 (46 y.o. [name] Primary Care Physician: Shirline Frees Other Clinician: Referring Physician: Treating Physician/Extender: Vance Peper in Treatment: 0 Education Assessment Education Provided To: Patient and Caregiver Education Topics Provided Pressure: Methods: Explain/Verbal Responses: State content correctly Wound/Skin Impairment: Methods: Explain/Verbal Responses: State content correctly Electronic Signature(s) Signed: 11/01/2021 5:59:56 PM By: Sharyn Creamer RN, BSN Entered By: Sharyn Creamer on 11/01/2021 14:58:25 -------------------------------------------------------------------------------- Wound Assessment  Details Patient Name: Date of Service: Shawn Rhodes, Shawn Rhodes [place] 11/01/2021 1:15 PM Medical Record Number: [id number] Patient Account Number: [id number] Date of Birth/Sex: Treating RN: 05-08-1975 (46 y.o. [name] Primary Care Tihanna Goodson: Shirline Frees Other Clinician: Referring Olga Bourbeau: Treating Areta Terwilliger/Extender: Vance Peper in Treatment: 0 Wound Status Wound Number: 1 Primary Etiology: Pressure Ulcer Wound Location: Right, Lateral,  Plantar Foot Wound Status: Open Wounding Event: Pressure Injury Comorbid History: Anemia, Type I Diabetes, Received Radiation Date Acquired: 09/20/2021 Weeks Of Treatment: 0 Clustered Wound: No Photos Wound Measurements Length: (cm) 2 Width: (cm) 2 Depth: (cm) 0 Area: (cm) Poe, Onyx ([id number]) Volume: (cm) .7 % Reduction in Area: .7 % Reduction in Volume: .1 Epithelialization: None 5.726 Tunneling: No [id number] Page 6 of 10 0.573 Undermining: No Wound Description Classification: Unstageable/Unclassified Exudate Amount: Medium Exudate Type: Serosanguineous Exudate Color: red, brown Foul Odor After Cleansing: No Slough/Fibrino Yes Wound Bed Granulation Amount: None Present (0%) Exposed Structure Necrotic Amount: Large (67-100%) Fascia Exposed: No Necrotic Quality: Eschar Fat Layer (Subcutaneous Tissue) Exposed: Yes Tendon Exposed: No Muscle Exposed: No Joint Exposed: No Bone Exposed: No Periwound Skin Texture Texture Color No Abnormalities Noted: No No Abnormalities Noted: No Moisture Temperature / Pain No Abnormalities Noted: No Temperature: Cool/Cold Electronic Signature(s) Signed: 11/01/2021 5:59:56 PM By: Sharyn Creamer RN, BSN Entered By: Sharyn Creamer on 11/01/2021 14:43:47 -------------------------------------------------------------------------------- Wound Assessment Details Patient Name: Date of Service: Shawn Rhodes, Shawn Rhodes [place] 11/01/2021 1:15 PM Medical Record Number: [id number] Patient Account Number: [id number] Date of Birth/Sex: Treating RN: [birth year] (46 y.o. [name] Primary Care Simeon Vera: Shirline Frees Other Clinician: Referring Eliel Dudding: Treating Jaxiel Kines/Extender: Vance Peper in Treatment: 0 Wound Status Wound Number: 2 Primary Etiology: Diabetic Wound/Ulcer of the Lower Extremity Wound Location: Left, Distal, Plantar Foot Wound Status: Open Wounding Event:  Blister Comorbid History: Anemia, Type I Diabetes, Received Radiation Date Acquired: 09/20/2021 Weeks Of Treatment: 0 Clustered Wound: No Photos Wound Measurements Length: (cm) 2.4 Width: (cm) 2.4 Depth: (cm) 0.3 Area: (cm) 4.524 Volume: (cm) 1.357 Sherbert, Lennette Bihari ([id number]) Wound Description Classification: Unable to visualize wound bed Exudate Amount: Medium Exudate Type: Serosanguineous Exudate Color: red, brown Foul Odor After Cleansing: No Slough/Fibrino Yes % Reduction in Area: % Reduction in Volume: Epithelialization: None Tunneling: No Undermining: No [id number] Page 7 of 10 Wound Bed Granulation Amount: None Present (0%) Exposed Structure Necrotic Amount: Large (67-100%) Fascia Exposed: No Necrotic Quality: Eschar Fat Layer (Subcutaneous Tissue) Exposed: Yes Tendon Exposed: No Muscle Exposed: No Joint Exposed: No Bone Exposed: No Periwound Skin Texture Texture Color No Abnormalities Noted: No No Abnormalities Noted: No Moisture Temperature / Pain No Abnormalities Noted: No Temperature: Cool/Cold Maceration: Yes Electronic Signature(s) Signed: 11/01/2021 5:59:56 PM By: Sharyn Creamer RN, BSN Entered By: Sharyn Creamer on 11/01/2021 15:47:06 -------------------------------------------------------------------------------- Wound Assessment Details Patient Name: Date of Service: Shawn Rhodes, Shawn Rhodes [place] 11/01/2021 1:15 PM Medical Record Number: [id number] Patient Account Number: [id number] Date of Birth/Sex: Treating RN: January 05, 1976 (46 y.o. [name] Primary Care Malkie Wille: Shirline Frees Other Clinician: Referring Eran Mistry: Treating Montavious Wierzba/Extender: Vance Peper in Treatment: 0 Wound Status Wound Number: 3 Primary Etiology: T be determined o Wound Location: Medial, Plantar Foot Wound Status: Open Wounding Event: Blister Comorbid History: Anemia, Type I Diabetes, Received Radiation Date  Acquired: 09/20/2021 Weeks Of Treatment: 0 Clustered Wound: No Photos Wound Measurements Length: (cm) 4 Width: (cm) 3.3 Depth: (cm) 0.3 Area: (cm) 10.36 Volume: (cm) 3.11 Colvard, Hazaiah ([id number]) Wound Description Classification: Unclassifiable Exudate Amount: Large  Exudate Type: Serosanguineous Exudate Color: red, brown Foul Odor After Cleansing: Slough/Fibrino % Reduction in Area: % Reduction in Volume: Tunneling: No 7 Undermining: No [id number] Page 8 of 10 No Yes Wound Bed Granulation Amount: None Present (0%) Exposed Structure Necrotic Amount: Large (67-100%) Fascia Exposed: No Necrotic Quality: Eschar Fat Layer (Subcutaneous Tissue) Exposed: Yes Tendon Exposed: No Muscle Exposed: No Joint Exposed: No Bone Exposed: No Periwound Skin Texture Texture Color No Abnormalities Noted: Yes No Abnormalities Noted: Yes Moisture Temperature / Pain No Abnormalities Noted: No Temperature: Cool/Cold Maceration: Yes Electronic Signature(s) Signed: 11/01/2021 5:59:56 PM By: Sharyn Creamer RN, BSN Entered By: Sharyn Creamer on 11/01/2021 14:45:16 -------------------------------------------------------------------------------- Wound Assessment Details Patient Name: Date of Service: Shawn Rhodes, Shawn Rhodes [place] 11/01/2021 1:15 PM Medical Record Number: Shawn Rhodes Patient Account Number: [id number] Date of Birth/Sex: Treating RN: [birth year] (46 y.o. [name] Primary Care Nema Oatley: Shirline Frees Other Clinician: Referring Yaw Escoto: Treating Jeffren Dombek/Extender: Vance Peper in Treatment: 0 Wound Status Wound Number: 4 Primary Etiology: T be determined o Wound Location: Left T - Web between 4th and 5th oe Wound Status: Open Wounding Event: Gradually Appeared Comorbid History: Anemia, Type I Diabetes, Received Radiation Date Acquired: 09/20/2021 Weeks Of Treatment: 0 Clustered Wound: No Photos Wound  Measurements Length: (cm) 3.2 Width: (cm) 0.7 Depth: (cm) 0.2 Area: (cm) 1.75 Volume: (cm) 0.35 Pirro, Kaian (Shawn Rhodes) Wound Description Classification: Full Thickness Without Exposed Suppor Exudate Amount: Medium Exudate Type: Serosanguineous Exudate Color: red, brown t Structures Foul Odor After Cleansing: Slough/Fibrino % Reduction in Area: % Reduction in Volume: Epithelialization: None 9 Tunneling: No 2 Undermining: No [id number] Page 9 of 10 No Yes Wound Bed Granulation Amount: Small (1-33%) Exposed Structure Granulation Quality: Red Fascia Exposed: No Necrotic Amount: Large (67-100%) Fat Layer (Subcutaneous Tissue) Exposed: Yes Necrotic Quality: Eschar, Adherent Slough Tendon Exposed: No Muscle Exposed: No Joint Exposed: No Bone Exposed: No Periwound Skin Texture Texture Color No Abnormalities Noted: Yes No Abnormalities Noted: Yes Moisture Temperature / Pain No Abnormalities Noted: No Temperature: Cool/Cold Maceration: Yes Electronic Signature(s) Signed: 11/01/2021 5:59:56 PM By: Sharyn Creamer RN, BSN Entered By: Sharyn Creamer on 11/01/2021 14:45:50 -------------------------------------------------------------------------------- Wound Assessment Details Patient Name: Date of Service: Shawn Rhodes, Shawn Rhodes [place] 11/01/2021 1:15 PM Medical Record Number: Shawn Rhodes Patient Account Number: [id number] Date of Birth/Sex: Treating RN: 07/02/1975 (46 y.o. [name] Primary Care Muaaz Brau: Shirline Frees Other Clinician: Referring Kacper Cartlidge: Treating Klair Leising/Extender: Vance Peper in Treatment: 0 Wound Status Wound Number: 5 Primary Etiology: Pressure Ulcer Wound Location: Sacrum Wound Status: Open Wounding Event: Pressure Injury Comorbid History: Anemia, Type I Diabetes, Received Radiation Date Acquired: 09/20/2021 Weeks Of Treatment: 0 Clustered Wound: No Photos Wound Measurements Length: (cm)  2.7 Width: (cm) 1.6 Depth: (cm) 0.1 Area: (cm) 3.3 Volume: (cm) 0.3 Zhan, Arek (Shawn Rhodes) Wound Description Classification: Category/Stage II Exudate Amount: Medium Exudate Type: Serosanguineous Exudate Color: red, brown Foul Odor After Cleansing: Slough/Fibrino % Reduction in Area: % Reduction in Volume: Epithelialization: None 93 Tunneling: No 39 Undermining: No [id number] Page 10 of 10 No Yes Wound Bed Granulation Amount: None Present (0%) Exposed Structure Necrotic Amount: Large (67-100%) Fascia Exposed: No Necrotic Quality: Adherent Slough Fat Layer (Subcutaneous Tissue) Exposed: Yes Tendon Exposed: No Muscle Exposed: No Joint Exposed: No Bone Exposed: No Periwound Skin Texture Texture Color No Abnormalities Noted: No No Abnormalities Noted: No Erythema: Yes Moisture No Abnormalities Noted: No Electronic Signature(s) Signed: 11/01/2021 5:59:56 PM By: Sharyn Creamer RN, BSN Entered By: Sharyn Creamer on 11/01/2021 14:42:02 --------------------------------------------------------------------------------  Vitals Details Patient Name: Date of Service: Shawn Rhodes, Shawn Rhodes [place] 11/01/2021 1:15 PM Medical Record Number: Shawn Rhodes Patient Account Number: [id number] Date of Birth/Sex: Treating RN: [birth year] (46 y.o. [name] Primary Care Grizelda Piscopo: Shirline Frees Other Clinician: Referring Everest Brod: Treating Vicktoria Muckey/Extender: Vance Peper in Treatment: 0 Vital Signs Time Taken: 14:58 Temperature (F): 98.1 Pulse (bpm): 85 Respiratory Rate (breaths/min): 18 Blood Pressure (mmHg): 95/60 Capillary Blood Glucose (mg/dl): 300 Reference Range: 80 - 120 mg / dl Notes pt checked sugar post meal, caregiver stated gave insulin then came to appt. hasnt checked since Electronic Signature(s) Signed: 11/01/2021 5:59:56 PM By: Sharyn Creamer RN, BSN Entered By: Sharyn Creamer on 11/01/2021 14:03:15

## 2021-11-04 DIAGNOSIS — Z930 Tracheostomy status: Secondary | ICD-10-CM | POA: Diagnosis not present

## 2021-11-04 DIAGNOSIS — C029 Malignant neoplasm of tongue, unspecified: Secondary | ICD-10-CM | POA: Diagnosis not present

## 2021-11-04 DIAGNOSIS — Z430 Encounter for attention to tracheostomy: Secondary | ICD-10-CM | POA: Diagnosis not present

## 2021-11-05 DIAGNOSIS — D649 Anemia, unspecified: Secondary | ICD-10-CM | POA: Diagnosis not present

## 2021-11-05 DIAGNOSIS — C7951 Secondary malignant neoplasm of bone: Secondary | ICD-10-CM | POA: Diagnosis not present

## 2021-11-05 DIAGNOSIS — L089 Local infection of the skin and subcutaneous tissue, unspecified: Secondary | ICD-10-CM | POA: Diagnosis not present

## 2021-11-05 DIAGNOSIS — R5383 Other fatigue: Secondary | ICD-10-CM | POA: Diagnosis not present

## 2021-11-05 DIAGNOSIS — R52 Pain, unspecified: Secondary | ICD-10-CM | POA: Diagnosis not present

## 2021-11-05 DIAGNOSIS — Z930 Tracheostomy status: Secondary | ICD-10-CM | POA: Diagnosis not present

## 2021-11-05 DIAGNOSIS — R279 Unspecified lack of coordination: Secondary | ICD-10-CM | POA: Diagnosis not present

## 2021-11-05 DIAGNOSIS — L03115 Cellulitis of right lower limb: Secondary | ICD-10-CM | POA: Diagnosis not present

## 2021-11-05 DIAGNOSIS — I9589 Other hypotension: Secondary | ICD-10-CM | POA: Diagnosis not present

## 2021-11-05 DIAGNOSIS — R7401 Elevation of levels of liver transaminase levels: Secondary | ICD-10-CM | POA: Diagnosis not present

## 2021-11-05 DIAGNOSIS — E10621 Type 1 diabetes mellitus with foot ulcer: Secondary | ICD-10-CM | POA: Diagnosis not present

## 2021-11-05 DIAGNOSIS — E1065 Type 1 diabetes mellitus with hyperglycemia: Secondary | ICD-10-CM | POA: Diagnosis not present

## 2021-11-05 DIAGNOSIS — K1379 Other lesions of oral mucosa: Secondary | ICD-10-CM | POA: Diagnosis not present

## 2021-11-05 DIAGNOSIS — R918 Other nonspecific abnormal finding of lung field: Secondary | ICD-10-CM | POA: Diagnosis not present

## 2021-11-05 DIAGNOSIS — C029 Malignant neoplasm of tongue, unspecified: Secondary | ICD-10-CM | POA: Diagnosis not present

## 2021-11-05 DIAGNOSIS — Y999 Unspecified external cause status: Secondary | ICD-10-CM | POA: Diagnosis not present

## 2021-11-05 DIAGNOSIS — R600 Localized edema: Secondary | ICD-10-CM | POA: Diagnosis not present

## 2021-11-05 DIAGNOSIS — C069 Malignant neoplasm of mouth, unspecified: Secondary | ICD-10-CM | POA: Diagnosis not present

## 2021-11-05 DIAGNOSIS — I959 Hypotension, unspecified: Secondary | ICD-10-CM | POA: Diagnosis not present

## 2021-11-05 DIAGNOSIS — D696 Thrombocytopenia, unspecified: Secondary | ICD-10-CM | POA: Diagnosis not present

## 2021-11-05 DIAGNOSIS — E13649 Other specified diabetes mellitus with hypoglycemia without coma: Secondary | ICD-10-CM | POA: Diagnosis not present

## 2021-11-05 DIAGNOSIS — S91302A Unspecified open wound, left foot, initial encounter: Secondary | ICD-10-CM | POA: Diagnosis not present

## 2021-11-05 DIAGNOSIS — E13621 Other specified diabetes mellitus with foot ulcer: Secondary | ICD-10-CM | POA: Diagnosis not present

## 2021-11-05 DIAGNOSIS — L97419 Non-pressure chronic ulcer of right heel and midfoot with unspecified severity: Secondary | ICD-10-CM | POA: Diagnosis not present

## 2021-11-05 DIAGNOSIS — E162 Hypoglycemia, unspecified: Secondary | ICD-10-CM | POA: Diagnosis not present

## 2021-11-05 DIAGNOSIS — Z931 Gastrostomy status: Secondary | ICD-10-CM | POA: Diagnosis not present

## 2021-11-05 DIAGNOSIS — R6521 Severe sepsis with septic shock: Secondary | ICD-10-CM | POA: Diagnosis not present

## 2021-11-05 DIAGNOSIS — X58XXXA Exposure to other specified factors, initial encounter: Secondary | ICD-10-CM | POA: Diagnosis not present

## 2021-11-05 DIAGNOSIS — K7200 Acute and subacute hepatic failure without coma: Secondary | ICD-10-CM | POA: Diagnosis not present

## 2021-11-05 DIAGNOSIS — L97529 Non-pressure chronic ulcer of other part of left foot with unspecified severity: Secondary | ICD-10-CM | POA: Diagnosis not present

## 2021-11-05 DIAGNOSIS — R531 Weakness: Secondary | ICD-10-CM | POA: Diagnosis not present

## 2021-11-05 DIAGNOSIS — Z515 Encounter for palliative care: Secondary | ICD-10-CM | POA: Diagnosis not present

## 2021-11-05 DIAGNOSIS — E10628 Type 1 diabetes mellitus with other skin complications: Secondary | ICD-10-CM | POA: Diagnosis not present

## 2021-11-05 DIAGNOSIS — S91301A Unspecified open wound, right foot, initial encounter: Secondary | ICD-10-CM | POA: Diagnosis not present

## 2021-11-05 DIAGNOSIS — M726 Necrotizing fasciitis: Secondary | ICD-10-CM | POA: Diagnosis not present

## 2021-11-05 DIAGNOSIS — R64 Cachexia: Secondary | ICD-10-CM | POA: Diagnosis not present

## 2021-11-05 DIAGNOSIS — D84821 Immunodeficiency due to drugs: Secondary | ICD-10-CM | POA: Diagnosis not present

## 2021-11-05 DIAGNOSIS — L97429 Non-pressure chronic ulcer of left heel and midfoot with unspecified severity: Secondary | ICD-10-CM | POA: Diagnosis not present

## 2021-11-05 DIAGNOSIS — R58 Hemorrhage, not elsewhere classified: Secondary | ICD-10-CM | POA: Diagnosis not present

## 2021-11-05 DIAGNOSIS — C770 Secondary and unspecified malignant neoplasm of lymph nodes of head, face and neck: Secondary | ICD-10-CM | POA: Diagnosis not present

## 2021-11-05 DIAGNOSIS — A419 Sepsis, unspecified organism: Secondary | ICD-10-CM | POA: Diagnosis not present

## 2021-11-05 DIAGNOSIS — E871 Hypo-osmolality and hyponatremia: Secondary | ICD-10-CM | POA: Diagnosis not present

## 2021-11-05 DIAGNOSIS — Z743 Need for continuous supervision: Secondary | ICD-10-CM | POA: Diagnosis not present

## 2021-11-05 DIAGNOSIS — Z66 Do not resuscitate: Secondary | ICD-10-CM | POA: Diagnosis not present

## 2021-11-05 DIAGNOSIS — R578 Other shock: Secondary | ICD-10-CM | POA: Diagnosis not present

## 2021-11-05 DIAGNOSIS — L03116 Cellulitis of left lower limb: Secondary | ICD-10-CM | POA: Diagnosis not present

## 2021-11-05 DIAGNOSIS — R031 Nonspecific low blood-pressure reading: Secondary | ICD-10-CM | POA: Diagnosis not present

## 2021-11-06 DIAGNOSIS — R0602 Shortness of breath: Secondary | ICD-10-CM | POA: Diagnosis not present

## 2021-11-06 DIAGNOSIS — E162 Hypoglycemia, unspecified: Secondary | ICD-10-CM | POA: Diagnosis not present

## 2021-11-06 DIAGNOSIS — R578 Other shock: Secondary | ICD-10-CM | POA: Diagnosis not present

## 2021-11-06 DIAGNOSIS — E871 Hypo-osmolality and hyponatremia: Secondary | ICD-10-CM | POA: Diagnosis not present

## 2021-11-06 DIAGNOSIS — E860 Dehydration: Secondary | ICD-10-CM | POA: Diagnosis not present

## 2021-11-06 DIAGNOSIS — C770 Secondary and unspecified malignant neoplasm of lymph nodes of head, face and neck: Secondary | ICD-10-CM | POA: Diagnosis not present

## 2021-11-06 DIAGNOSIS — E1052 Type 1 diabetes mellitus with diabetic peripheral angiopathy with gangrene: Secondary | ICD-10-CM | POA: Diagnosis not present

## 2021-11-06 DIAGNOSIS — I959 Hypotension, unspecified: Secondary | ICD-10-CM | POA: Diagnosis not present

## 2021-11-06 DIAGNOSIS — R58 Hemorrhage, not elsewhere classified: Secondary | ICD-10-CM | POA: Diagnosis not present

## 2021-11-06 DIAGNOSIS — R579 Shock, unspecified: Secondary | ICD-10-CM | POA: Diagnosis not present

## 2021-11-06 DIAGNOSIS — R000 Tachycardia, unspecified: Secondary | ICD-10-CM | POA: Diagnosis not present

## 2021-11-06 DIAGNOSIS — Z931 Gastrostomy status: Secondary | ICD-10-CM | POA: Diagnosis not present

## 2021-11-06 DIAGNOSIS — R6520 Severe sepsis without septic shock: Secondary | ICD-10-CM | POA: Diagnosis not present

## 2021-11-06 DIAGNOSIS — Z930 Tracheostomy status: Secondary | ICD-10-CM | POA: Diagnosis not present

## 2021-11-06 DIAGNOSIS — R6521 Severe sepsis with septic shock: Secondary | ICD-10-CM | POA: Diagnosis not present

## 2021-11-06 DIAGNOSIS — A419 Sepsis, unspecified organism: Secondary | ICD-10-CM | POA: Diagnosis not present

## 2021-11-06 DIAGNOSIS — C029 Malignant neoplasm of tongue, unspecified: Secondary | ICD-10-CM | POA: Diagnosis not present

## 2021-11-06 DIAGNOSIS — C7951 Secondary malignant neoplasm of bone: Secondary | ICD-10-CM | POA: Diagnosis not present

## 2021-11-06 DIAGNOSIS — R7401 Elevation of levels of liver transaminase levels: Secondary | ICD-10-CM | POA: Diagnosis not present

## 2021-11-06 DIAGNOSIS — D696 Thrombocytopenia, unspecified: Secondary | ICD-10-CM | POA: Diagnosis not present

## 2021-11-06 DIAGNOSIS — C069 Malignant neoplasm of mouth, unspecified: Secondary | ICD-10-CM | POA: Diagnosis not present

## 2021-11-06 DIAGNOSIS — E10649 Type 1 diabetes mellitus with hypoglycemia without coma: Secondary | ICD-10-CM | POA: Diagnosis not present

## 2021-11-08 DEATH — deceased

## 2021-11-15 ENCOUNTER — Encounter (HOSPITAL_BASED_OUTPATIENT_CLINIC_OR_DEPARTMENT_OTHER): Payer: 59 | Admitting: Internal Medicine

## 2021-11-25 DIAGNOSIS — C029 Malignant neoplasm of tongue, unspecified: Secondary | ICD-10-CM | POA: Diagnosis not present

## 2021-11-25 DIAGNOSIS — Z430 Encounter for attention to tracheostomy: Secondary | ICD-10-CM | POA: Diagnosis not present

## 2021-11-25 DIAGNOSIS — Z930 Tracheostomy status: Secondary | ICD-10-CM | POA: Diagnosis not present

## 2021-12-20 ENCOUNTER — Encounter (INDEPENDENT_AMBULATORY_CARE_PROVIDER_SITE_OTHER): Payer: Self-pay | Admitting: Ophthalmology

## 2021-12-25 DIAGNOSIS — C029 Malignant neoplasm of tongue, unspecified: Secondary | ICD-10-CM | POA: Diagnosis not present

## 2021-12-25 DIAGNOSIS — Z430 Encounter for attention to tracheostomy: Secondary | ICD-10-CM | POA: Diagnosis not present

## 2021-12-25 DIAGNOSIS — Z930 Tracheostomy status: Secondary | ICD-10-CM | POA: Diagnosis not present

## 2023-06-04 IMAGING — XA IR PERC PLACEMENT GASTROSTOMY
1 series · 18 of 24 positions shown · non-contrast
Comparison: none

INDICATION: 45-year-old with squamous cell carcinoma of the tongue. Malnutrition
due to difficulty eating and drinking.

[Series 4: 1 · axial · 2.0mm · 0.43mm/px · z∈[-704,-484]mm · 18 of 40 slices shown]
[im 1/40  full-range]
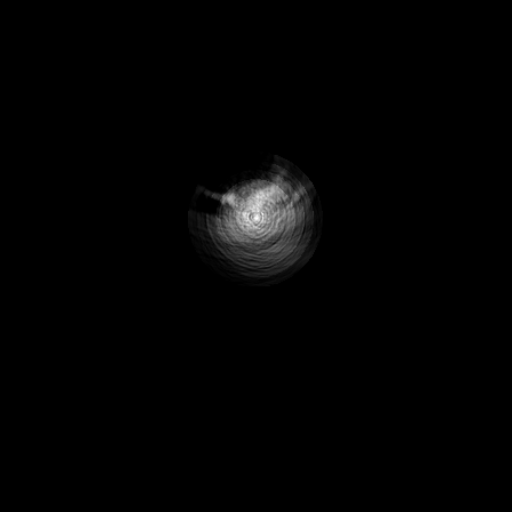
[im 4/40]
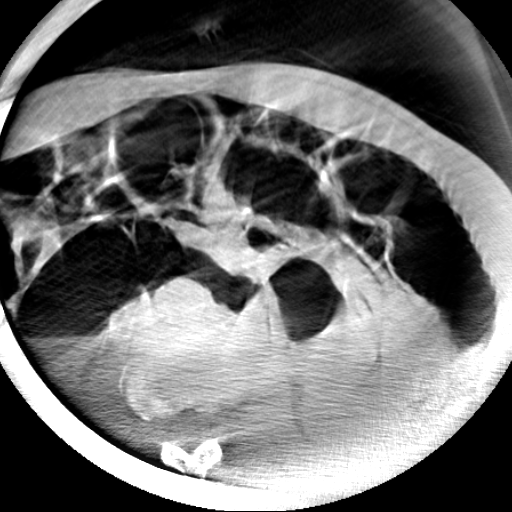
[im 6/40]
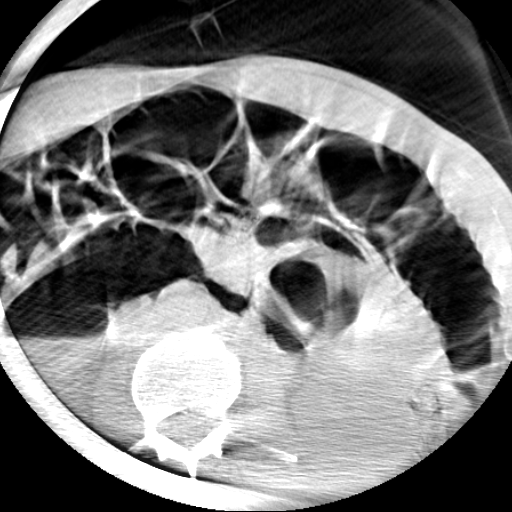
[im 7/40]
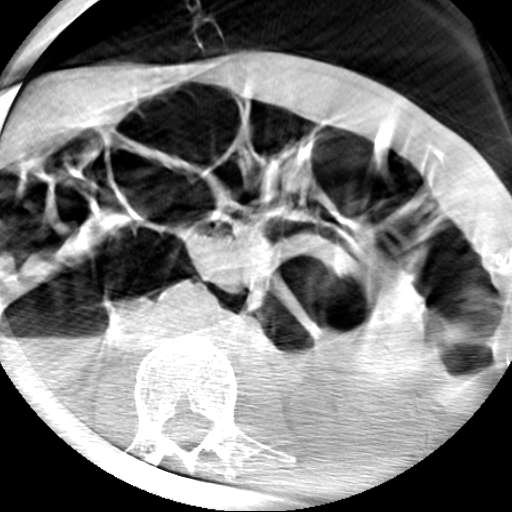
[im 11/40]
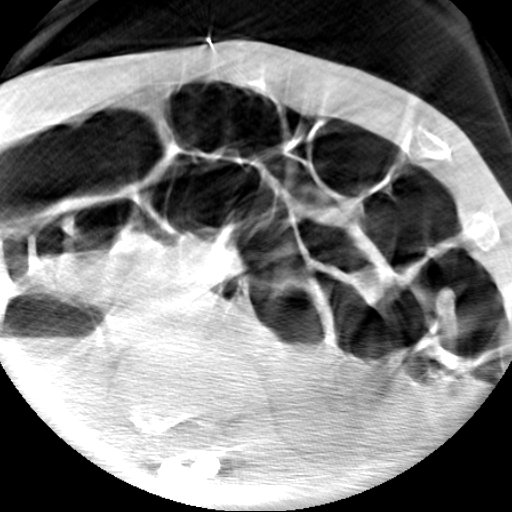
[im 12/40]
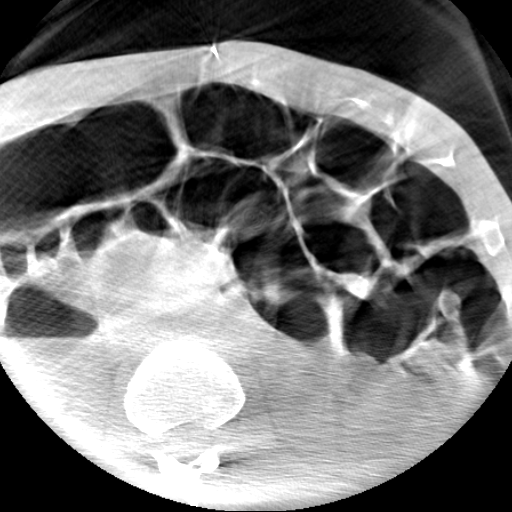
[im 14/40]
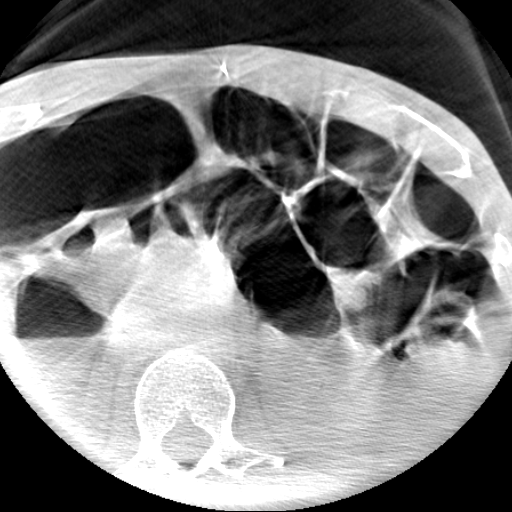
[im 17/40]
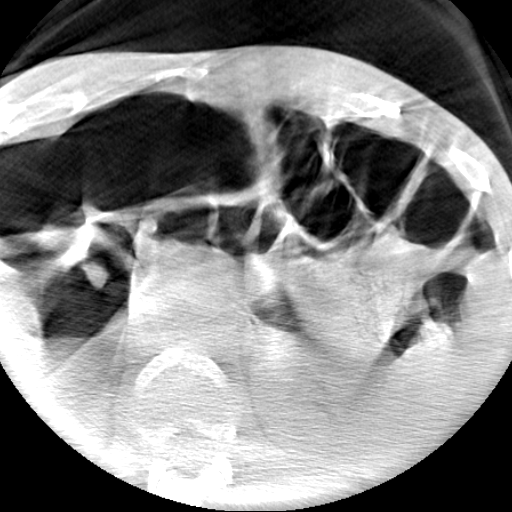
[im 19/40]
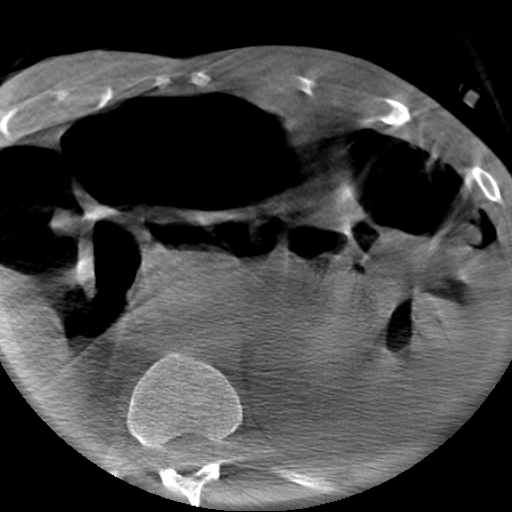
[im 21/40]
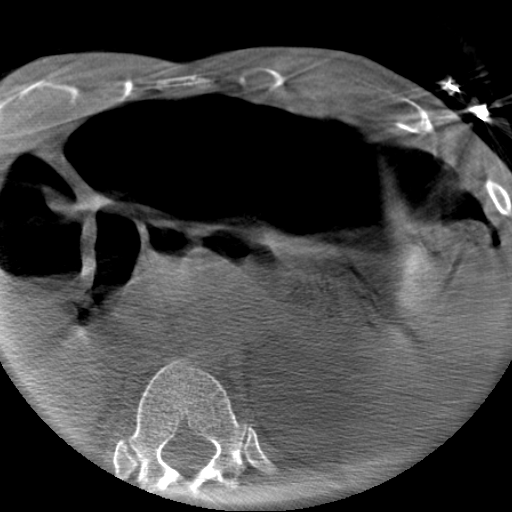
[im 24/40]
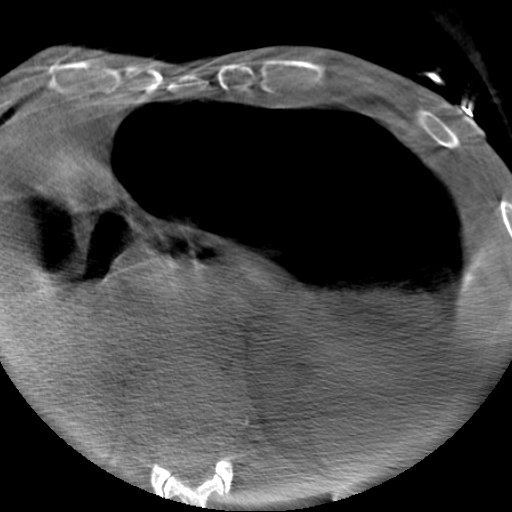
[im 26/40]
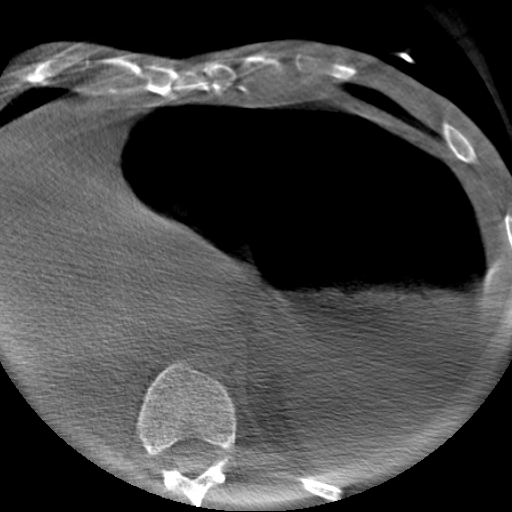
[im 28/40]
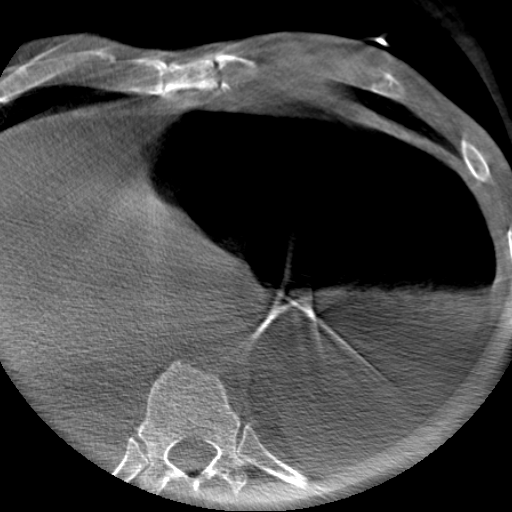
[im 31/40]
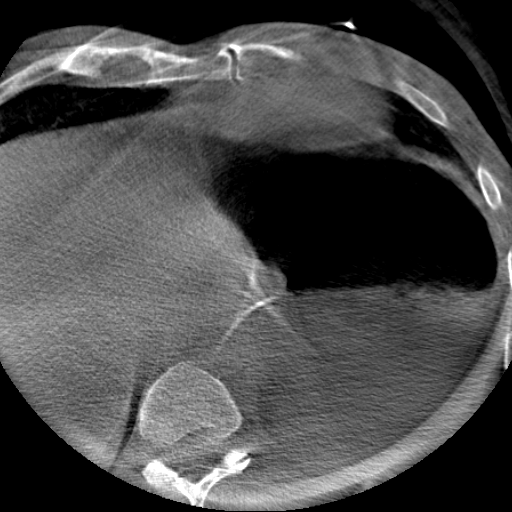
[im 33/40]
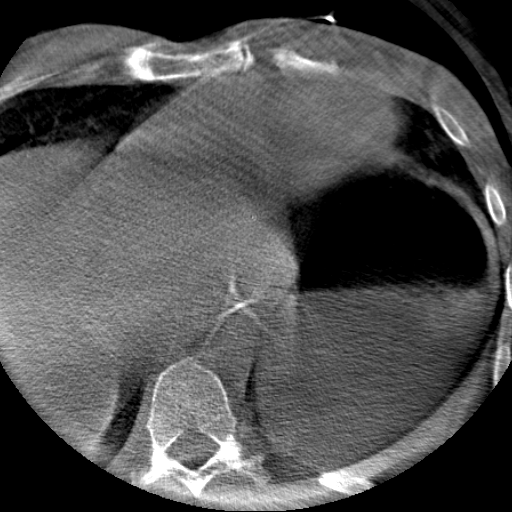
[im 34/40]
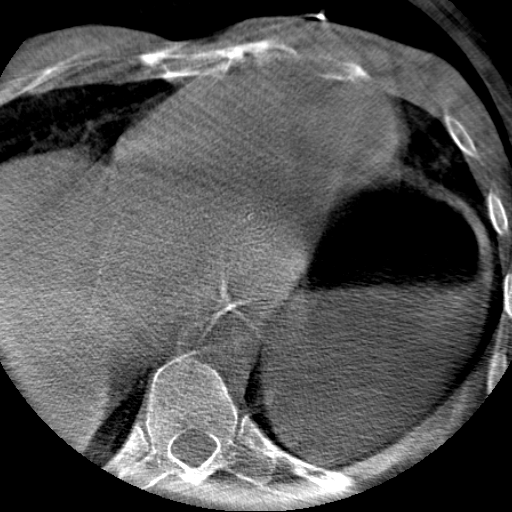
[im 38/40]
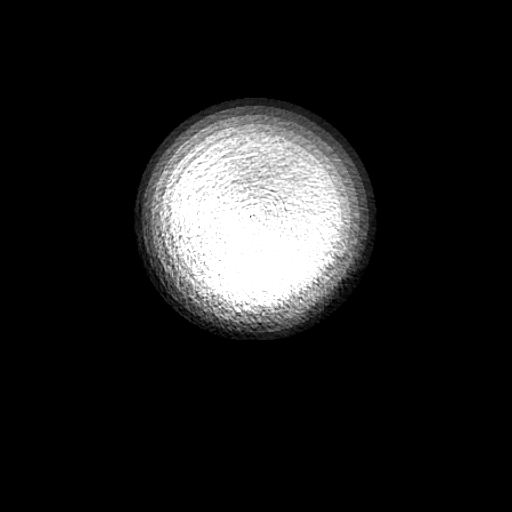
[im 40/40]
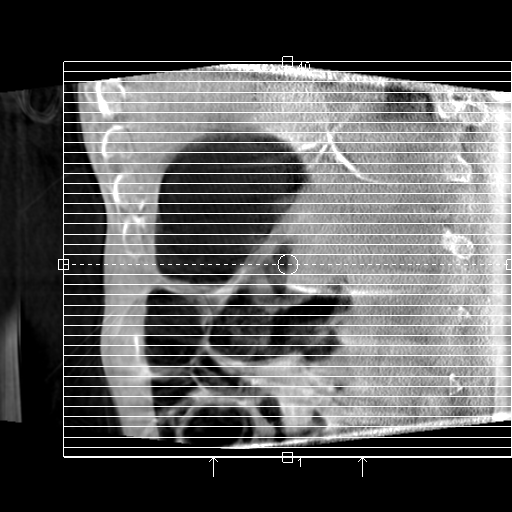

[18 of 24 positions shown; findings below may reference images not displayed]

EXAM:
ATTEMPTED PERCUTANEOUS GASTROSTOMY TUBE PLACEMENT WITH FLUOROSCOPY
AND CONE BEAM CT

MEDICATIONS:
Ancef 2 g; Antibiotics were administered within 1 hour of the
procedure. Glucagon 0.5 mg

ANESTHESIA/SEDATION:
Moderate (conscious) sedation was employed during this procedure. A
total of Versed 3.0mg and fentanyl 150 mcg was administered
intravenously at the order of the provider performing the procedure.

Total intra-service moderate sedation time: 44 minutes.

Patient's level of consciousness and vital signs were monitored
continuously by radiology nurse throughout the procedure under the
supervision of the provider performing the procedure.

FLUOROSCOPY:
Radiation Exposure Index (as provided by the fluoroscopic device):
65 mGy Kerma

COMPLICATIONS:
None immediate.

PROCEDURE:
The procedure was explained to the patient. The risks and benefits
of the procedure were discussed and the patient's questions were
addressed. Informed consent was obtained from the patient. Patient
was placed on the interventional table. A 5 French nasogastric tube
was easily advanced into the stomach using fluoroscopy. The anterior
abdomen was prepped and draped in sterile fashion. Maximal barrier
sterile technique was utilized including caps, mask, sterile gowns,
sterile gloves, sterile drape, hand hygiene and skin antiseptic.
This stomach was insufflated with gas. Patient has a large amount of
bowel gas throughout the abdomen. Stomach has a horizontal
orientation and majority the stomach is underneath the xiphoid
process and anterior ribs. Skin was anesthetized with 1% lidocaine.
Attempted to advance a needle into the stomach but needle placement
would require a severe caudal to cranial angulation. Unfortunately,
there were multiple gas-filled loops of bowel just underneath the
stomach and lateral view raised concern that the needle may traverse
a loop of bowel prior to entering the stomach. Therefore, cone beam
CT was performed in order to better evaluate the anatomy. Additional
gas was placed in the stomach but a safe percutaneous window could
not be identified for gastrostomy tube placement. Therefore, the
gastrostomy tube procedure was not performed.
FINDINGS: Stomach has a very horizontal orientation and despite insufflation
with air, majority of the stomach is underneath the patient's
anterior ribs and the xiphoid process. In addition, there is a large
amount of gas-filled bowel underneath the stomach which limits our
ability to advance the needle into the stomach from a caudal to
cranial orientation. Safe percutaneous window was not identified for
gastrostomy tube placement.
IMPRESSION: 1. Percutaneous gastrostomy tube procedure was aborted due to
difficult anatomy and cannot identify a safe percutaneous window for
gastrostomy tube placement. Recommend surgical consultation for
surgical gastrostomy or jejunal feeding tube.

## 2023-06-04 IMAGING — XA IR NG/OG TUBE BY MD
6 series · 6 of 6 positions shown · non-contrast
Comparison: none

INDICATION: 45-year-old with squamous cell carcinoma of the tongue and needs a
feeding tube for malnutrition. Percutaneous gastrostomy tube
procedure was aborted due to difficult anatomy and could not
identify a safe percutaneous window.

[Series 1: fl (-) angio · 1 of 1 slices shown (1 of 6)]
[im 1/1]
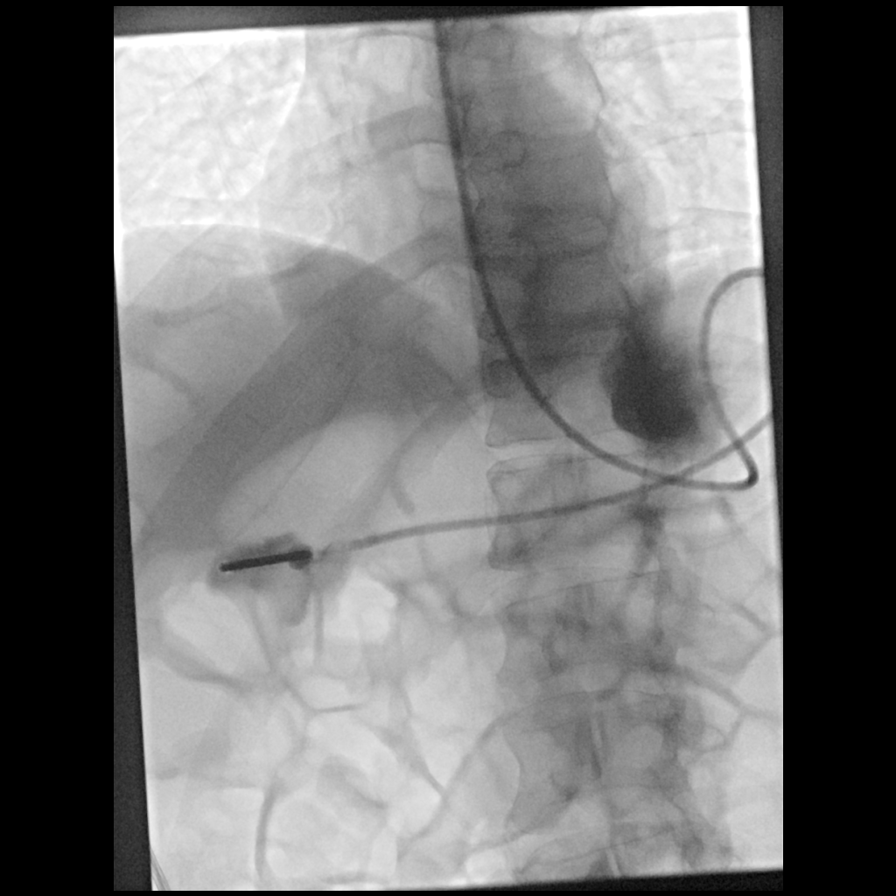

[Series 2: fl (-) angio · 1 of 1 slices shown (2 of 6)]
[im 1/1]
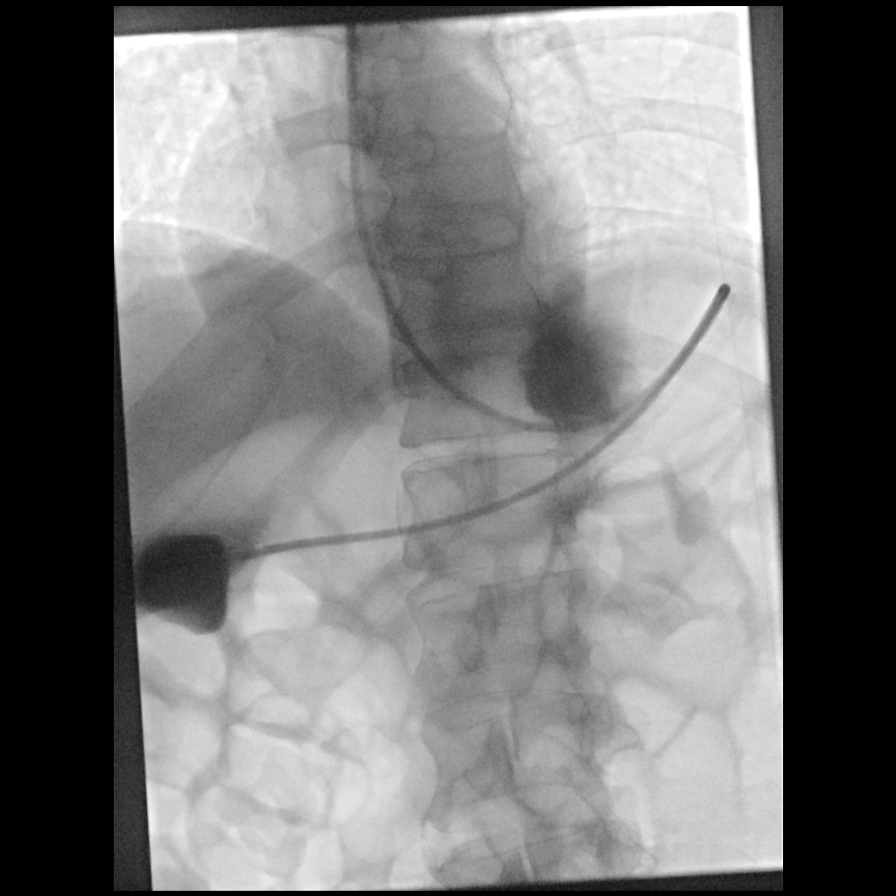

[Series 3: fl (-) angio · 1 of 1 slices shown (3 of 6)]
[im 1/1]
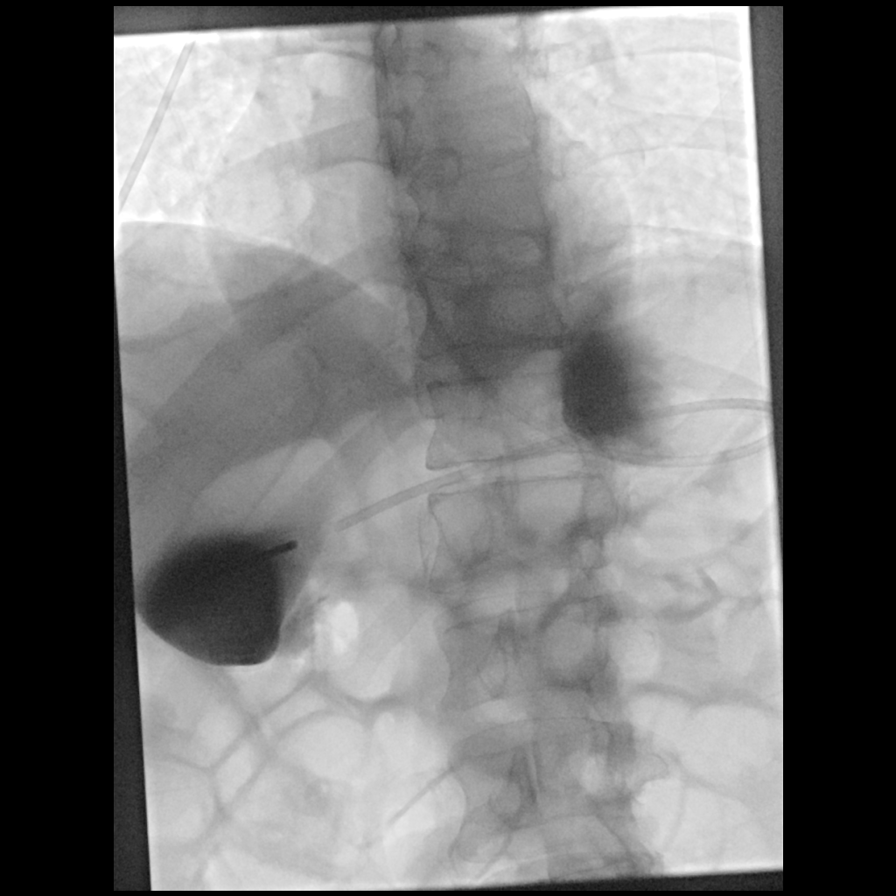

[Series 4: fl (-) angio · 1 of 1 slices shown (4 of 6)]
[im 1/1]
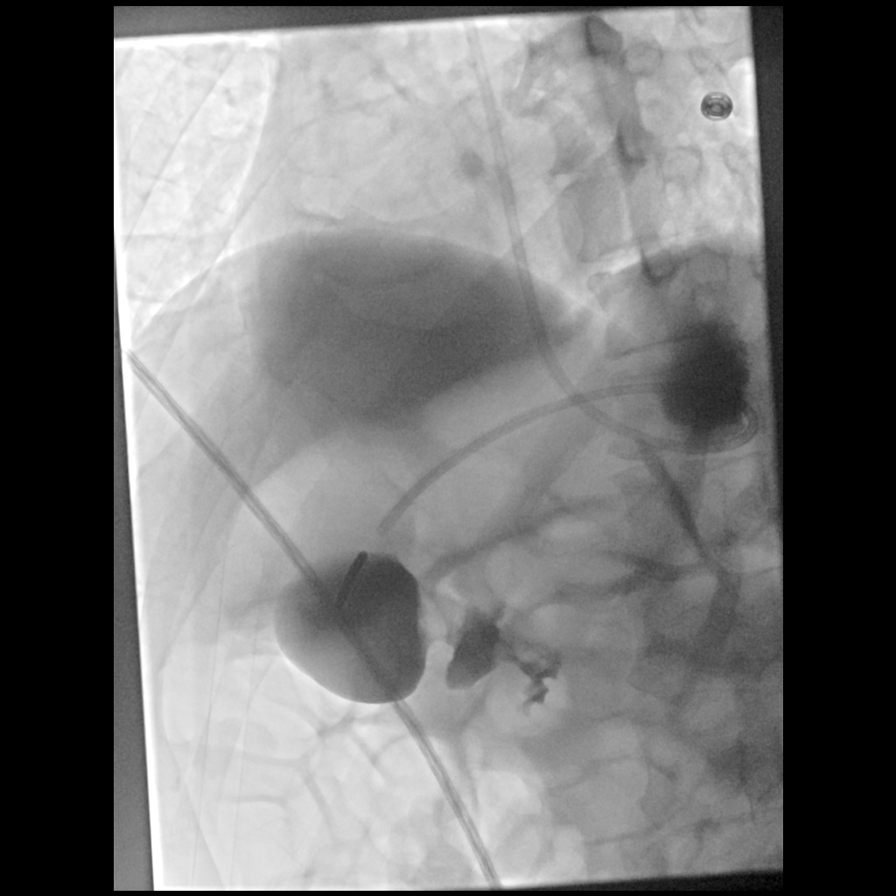

[Series 5: fl (-) angio · 1 of 1 slices shown (5 of 6)]
[im 1/1]
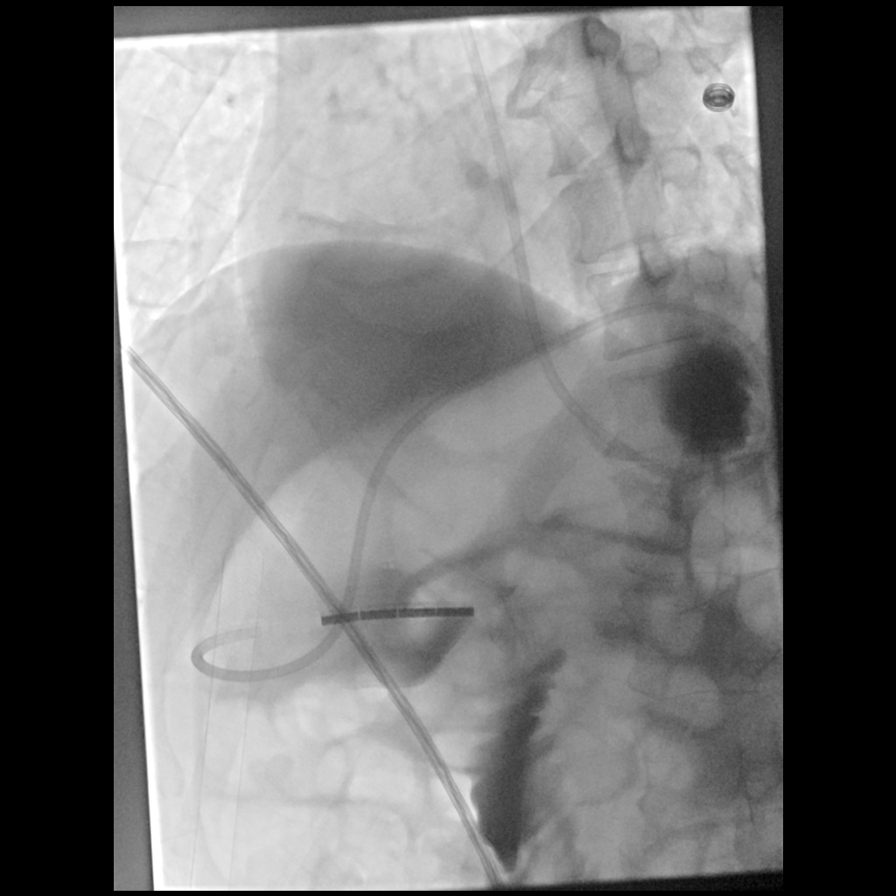

[Series 6: fl (-) angio · 1 of 1 slices shown (6 of 6)]
[im 1/1]
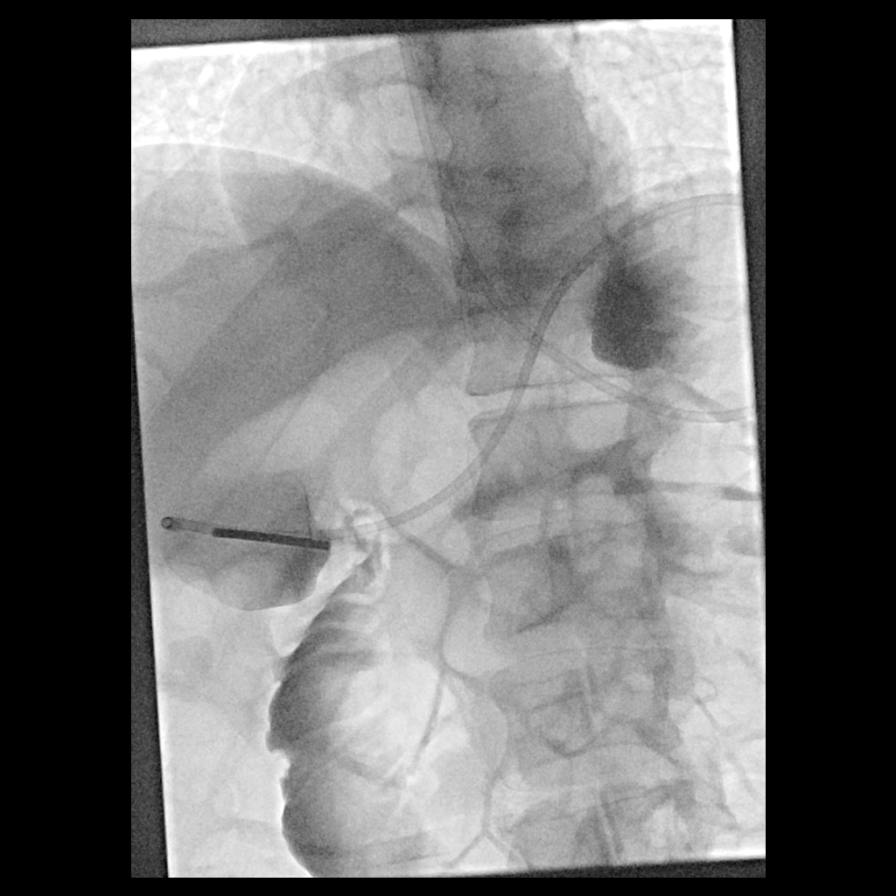

[6 of 6 positions shown; findings below may reference images not displayed]

EXAM:
PLACEMENT OF NASOGASTRIC FEEDING TUBE WITH FLUOROSCOPY

MEDICATIONS:
None

ANESTHESIA/SEDATION:
None

FLUOROSCOPY:
Radiation Exposure Index (as provided by the fluoroscopic device):
33.7 mGy Kerma

COMPLICATIONS:
None immediate.

PROCEDURE:
Patient was placed on the interventional table. A Cortrak tube was
easily advanced down the right nostril into the stomach with
fluoroscopy. The tube was preferentially coiling in the gastric
fundus. Eventually, the tube was directed toward the distal stomach
and placed near the duodenal bulb. Contrast injection confirmed
placement near the duodenal bulb. The tube was flushed with saline.
Tube was secured to the patient's nose.
FINDINGS: Feeding tube tip is near the duodenal bulb.
IMPRESSION: Successful placement of a nasogastric feeding tube with fluoroscopy.
Feeding tube is ready to be used.
